# Patient Record
Sex: Male | Born: 1960 | Race: Black or African American | Hispanic: No | Marital: Single | Smoking: Current every day smoker
Health system: Southern US, Community
[De-identification: ages and names within clinical notes are randomized; demographics above are authoritative.]

## PROBLEM LIST (undated history)

## (undated) DIAGNOSIS — G8929 Other chronic pain: Secondary | ICD-10-CM

## (undated) DIAGNOSIS — I2510 Atherosclerotic heart disease of native coronary artery without angina pectoris: Secondary | ICD-10-CM

## (undated) DIAGNOSIS — N289 Disorder of kidney and ureter, unspecified: Secondary | ICD-10-CM

## (undated) DIAGNOSIS — I10 Essential (primary) hypertension: Secondary | ICD-10-CM

## (undated) DIAGNOSIS — Z21 Asymptomatic human immunodeficiency virus [HIV] infection status: Secondary | ICD-10-CM

## (undated) DIAGNOSIS — M545 Low back pain, unspecified: Secondary | ICD-10-CM

## (undated) DIAGNOSIS — M1990 Unspecified osteoarthritis, unspecified site: Secondary | ICD-10-CM

## (undated) DIAGNOSIS — I219 Acute myocardial infarction, unspecified: Secondary | ICD-10-CM

## (undated) DIAGNOSIS — B20 Human immunodeficiency virus [HIV] disease: Secondary | ICD-10-CM

## (undated) DIAGNOSIS — K219 Gastro-esophageal reflux disease without esophagitis: Secondary | ICD-10-CM

## (undated) DIAGNOSIS — G43909 Migraine, unspecified, not intractable, without status migrainosus: Secondary | ICD-10-CM

## (undated) DIAGNOSIS — G629 Polyneuropathy, unspecified: Secondary | ICD-10-CM

---

## 1997-04-26 HISTORY — PX: INCISION AND DRAINAGE OF WOUND: SHX1803

## 2007-09-10 ENCOUNTER — Emergency Department (HOSPITAL_COMMUNITY): Admission: EM | Admit: 2007-09-10 | Discharge: 2007-09-10 | Payer: Self-pay | Admitting: Emergency Medicine

## 2013-02-24 DIAGNOSIS — I219 Acute myocardial infarction, unspecified: Secondary | ICD-10-CM

## 2013-02-24 HISTORY — DX: Acute myocardial infarction, unspecified: I21.9

## 2013-02-24 HISTORY — PX: CORONARY ANGIOPLASTY WITH STENT PLACEMENT: SHX49

## 2013-05-24 DIAGNOSIS — E785 Hyperlipidemia, unspecified: Secondary | ICD-10-CM | POA: Diagnosis present

## 2013-12-31 ENCOUNTER — Inpatient Hospital Stay (HOSPITAL_COMMUNITY): Payer: PRIVATE HEALTH INSURANCE

## 2013-12-31 ENCOUNTER — Inpatient Hospital Stay (HOSPITAL_BASED_OUTPATIENT_CLINIC_OR_DEPARTMENT_OTHER)
Admission: EM | Admit: 2013-12-31 | Discharge: 2014-01-03 | DRG: 896 | Payer: PRIVATE HEALTH INSURANCE | Attending: Internal Medicine | Admitting: Internal Medicine

## 2013-12-31 ENCOUNTER — Emergency Department (HOSPITAL_BASED_OUTPATIENT_CLINIC_OR_DEPARTMENT_OTHER): Payer: PRIVATE HEALTH INSURANCE

## 2013-12-31 ENCOUNTER — Ambulatory Visit (HOSPITAL_COMMUNITY)

## 2013-12-31 ENCOUNTER — Encounter (HOSPITAL_BASED_OUTPATIENT_CLINIC_OR_DEPARTMENT_OTHER): Payer: Self-pay | Admitting: Emergency Medicine

## 2013-12-31 DIAGNOSIS — F19939 Other psychoactive substance use, unspecified with withdrawal, unspecified: Secondary | ICD-10-CM | POA: Diagnosis not present

## 2013-12-31 DIAGNOSIS — R1115 Cyclical vomiting syndrome unrelated to migraine: Secondary | ICD-10-CM

## 2013-12-31 DIAGNOSIS — I6350 Cerebral infarction due to unspecified occlusion or stenosis of unspecified cerebral artery: Secondary | ICD-10-CM | POA: Diagnosis present

## 2013-12-31 DIAGNOSIS — F1910 Other psychoactive substance abuse, uncomplicated: Secondary | ICD-10-CM | POA: Diagnosis present

## 2013-12-31 DIAGNOSIS — R9089 Other abnormal findings on diagnostic imaging of central nervous system: Secondary | ICD-10-CM | POA: Diagnosis present

## 2013-12-31 DIAGNOSIS — N183 Chronic kidney disease, stage 3 unspecified: Secondary | ICD-10-CM | POA: Diagnosis present

## 2013-12-31 DIAGNOSIS — G43909 Migraine, unspecified, not intractable, without status migrainosus: Secondary | ICD-10-CM | POA: Diagnosis present

## 2013-12-31 DIAGNOSIS — R4182 Altered mental status, unspecified: Secondary | ICD-10-CM | POA: Insufficient documentation

## 2013-12-31 DIAGNOSIS — I10 Essential (primary) hypertension: Secondary | ICD-10-CM | POA: Diagnosis present

## 2013-12-31 DIAGNOSIS — I6300 Cerebral infarction due to thrombosis of unspecified precerebral artery: Secondary | ICD-10-CM

## 2013-12-31 DIAGNOSIS — N179 Acute kidney failure, unspecified: Secondary | ICD-10-CM | POA: Diagnosis present

## 2013-12-31 DIAGNOSIS — G9340 Encephalopathy, unspecified: Secondary | ICD-10-CM | POA: Diagnosis present

## 2013-12-31 DIAGNOSIS — Z23 Encounter for immunization: Secondary | ICD-10-CM | POA: Diagnosis not present

## 2013-12-31 DIAGNOSIS — F17200 Nicotine dependence, unspecified, uncomplicated: Secondary | ICD-10-CM | POA: Diagnosis present

## 2013-12-31 DIAGNOSIS — R112 Nausea with vomiting, unspecified: Secondary | ICD-10-CM | POA: Diagnosis present

## 2013-12-31 DIAGNOSIS — N19 Unspecified kidney failure: Secondary | ICD-10-CM | POA: Diagnosis present

## 2013-12-31 DIAGNOSIS — R930 Abnormal findings on diagnostic imaging of skull and head, not elsewhere classified: Secondary | ICD-10-CM

## 2013-12-31 DIAGNOSIS — Z21 Asymptomatic human immunodeficiency virus [HIV] infection status: Secondary | ICD-10-CM | POA: Diagnosis present

## 2013-12-31 HISTORY — DX: Acute myocardial infarction, unspecified: I21.9

## 2013-12-31 HISTORY — DX: Migraine, unspecified, not intractable, without status migrainosus: G43.909

## 2013-12-31 HISTORY — DX: Low back pain, unspecified: M54.50

## 2013-12-31 HISTORY — DX: Human immunodeficiency virus (HIV) disease: B20

## 2013-12-31 HISTORY — DX: Gastro-esophageal reflux disease without esophagitis: K21.9

## 2013-12-31 HISTORY — DX: Low back pain: M54.5

## 2013-12-31 HISTORY — DX: Other chronic pain: G89.29

## 2013-12-31 HISTORY — DX: Unspecified osteoarthritis, unspecified site: M19.90

## 2013-12-31 HISTORY — DX: Essential (primary) hypertension: I10

## 2013-12-31 HISTORY — DX: Asymptomatic human immunodeficiency virus (hiv) infection status: Z21

## 2013-12-31 LAB — CBC
HCT: 48 % (ref 39.0–52.0)
Hemoglobin: 17.2 g/dL — ABNORMAL HIGH (ref 13.0–17.0)
MCH: 30.6 pg (ref 26.0–34.0)
MCHC: 35.8 g/dL (ref 30.0–36.0)
MCV: 85.4 fL (ref 78.0–100.0)
PLATELETS: 257 10*3/uL (ref 150–400)
RBC: 5.62 MIL/uL (ref 4.22–5.81)
RDW: 13.1 % (ref 11.5–15.5)
WBC: 12.4 10*3/uL — ABNORMAL HIGH (ref 4.0–10.5)

## 2013-12-31 LAB — BLOOD GAS, ARTERIAL
Acid-Base Excess: 1 mmol/L (ref 0.0–2.0)
BICARBONATE: 24.9 meq/L — AB (ref 20.0–24.0)
Drawn by: 36529
FIO2: 0.21 %
O2 Saturation: 93.2 %
PATIENT TEMPERATURE: 98.6
TCO2: 26 mmol/L (ref 0–100)
pCO2 arterial: 37.8 mmHg (ref 35.0–45.0)
pH, Arterial: 7.433 (ref 7.350–7.450)
pO2, Arterial: 66.1 mmHg — ABNORMAL LOW (ref 80.0–100.0)

## 2013-12-31 LAB — DIFFERENTIAL
Basophils Absolute: 0 10*3/uL (ref 0.0–0.1)
Basophils Relative: 0 % (ref 0–1)
Eosinophils Absolute: 0 10*3/uL (ref 0.0–0.7)
Eosinophils Relative: 0 % (ref 0–5)
LYMPHS ABS: 2.8 10*3/uL (ref 0.7–4.0)
Lymphocytes Relative: 22 % (ref 12–46)
MONOS PCT: 10 % (ref 3–12)
Monocytes Absolute: 1.3 10*3/uL — ABNORMAL HIGH (ref 0.1–1.0)
NEUTROS ABS: 8.6 10*3/uL — AB (ref 1.7–7.7)
NEUTROS PCT: 68 % (ref 43–77)

## 2013-12-31 LAB — RAPID URINE DRUG SCREEN, HOSP PERFORMED
Amphetamines: NOT DETECTED
BENZODIAZEPINES: POSITIVE — AB
Barbiturates: NOT DETECTED
Cocaine: POSITIVE — AB
Opiates: NOT DETECTED
Tetrahydrocannabinol: POSITIVE — AB

## 2013-12-31 LAB — COMPREHENSIVE METABOLIC PANEL
ALT: 19 U/L (ref 0–53)
ALT: 16 U/L (ref 0–53)
ANION GAP: 12 (ref 5–15)
AST: 18 U/L (ref 0–37)
AST: 25 U/L (ref 0–37)
Albumin: 4 g/dL (ref 3.5–5.2)
Albumin: 3.5 g/dL (ref 3.5–5.2)
Alkaline Phosphatase: 59 U/L (ref 39–117)
Alkaline Phosphatase: 53 U/L (ref 39–117)
Anion gap: 16 — ABNORMAL HIGH (ref 5–15)
BUN: 12 mg/dL (ref 6–23)
BUN: 10 mg/dL (ref 6–23)
CALCIUM: 10.5 mg/dL (ref 8.4–10.5)
CALCIUM: 8.9 mg/dL (ref 8.4–10.5)
CO2: 23 meq/L (ref 19–32)
CO2: 24 meq/L (ref 19–32)
CREATININE: 1.6 mg/dL — AB (ref 0.50–1.35)
Chloride: 95 mEq/L — ABNORMAL LOW (ref 96–112)
Chloride: 97 mEq/L (ref 96–112)
Creatinine, Ser: 1.41 mg/dL — ABNORMAL HIGH (ref 0.50–1.35)
GFR calc Af Amer: 56 mL/min — ABNORMAL LOW (ref >90)
GFR, EST AFRICAN AMERICAN: 65 mL/min — AB (ref >90)
GFR, EST NON AFRICAN AMERICAN: 48 mL/min — AB (ref >90)
GFR, EST NON AFRICAN AMERICAN: 56 mL/min — AB (ref >90)
GLUCOSE: 116 mg/dL — AB (ref 70–99)
Glucose, Bld: 118 mg/dL — ABNORMAL HIGH (ref 70–99)
Potassium: 4 mEq/L (ref 3.7–5.3)
Potassium: 3.9 mEq/L (ref 3.7–5.3)
Sodium: 135 mEq/L — ABNORMAL LOW (ref 137–147)
Sodium: 132 mEq/L — ABNORMAL LOW (ref 137–147)
TOTAL PROTEIN: 10.1 g/dL — AB (ref 6.0–8.3)
Total Bilirubin: 0.7 mg/dL (ref 0.3–1.2)
Total Bilirubin: 0.4 mg/dL (ref 0.3–1.2)
Total Protein: 8.4 g/dL — ABNORMAL HIGH (ref 6.0–8.3)

## 2013-12-31 LAB — URINE MICROSCOPIC-ADD ON

## 2013-12-31 LAB — URINALYSIS, ROUTINE W REFLEX MICROSCOPIC
Bilirubin Urine: NEGATIVE
GLUCOSE, UA: NEGATIVE mg/dL
Ketones, ur: 15 mg/dL — AB
LEUKOCYTES UA: NEGATIVE
Nitrite: NEGATIVE
PROTEIN: 100 mg/dL — AB
SPECIFIC GRAVITY, URINE: 1.014 (ref 1.005–1.030)
UROBILINOGEN UA: 0.2 mg/dL (ref 0.0–1.0)
pH: 6 (ref 5.0–8.0)

## 2013-12-31 LAB — CBG MONITORING, ED: Glucose-Capillary: 102 mg/dL — ABNORMAL HIGH (ref 70–99)

## 2013-12-31 LAB — AMMONIA: AMMONIA: 29 umol/L (ref 11–60)

## 2013-12-31 LAB — CBC WITH DIFFERENTIAL/PLATELET
Basophils Absolute: 0 10*3/uL (ref 0.0–0.1)
Basophils Relative: 0 % (ref 0–1)
EOS ABS: 0 10*3/uL (ref 0.0–0.7)
Eosinophils Relative: 0 % (ref 0–5)
HCT: 42.2 % (ref 39.0–52.0)
HEMOGLOBIN: 15.3 g/dL (ref 13.0–17.0)
Lymphocytes Relative: 26 % (ref 12–46)
Lymphs Abs: 2.7 10*3/uL (ref 0.7–4.0)
MCH: 31.3 pg (ref 26.0–34.0)
MCHC: 36.3 g/dL — AB (ref 30.0–36.0)
MCV: 86.3 fL (ref 78.0–100.0)
MONOS PCT: 9 % (ref 3–12)
Monocytes Absolute: 1 10*3/uL (ref 0.1–1.0)
NEUTROS ABS: 6.7 10*3/uL (ref 1.7–7.7)
NEUTROS PCT: 65 % (ref 43–77)
PLATELETS: 216 10*3/uL (ref 150–400)
RBC: 4.89 MIL/uL (ref 4.22–5.81)
RDW: 13.2 % (ref 11.5–15.5)
WBC: 10.3 10*3/uL (ref 4.0–10.5)

## 2013-12-31 LAB — I-STAT CG4 LACTIC ACID, ED: LACTIC ACID, VENOUS: 1.37 mmol/L (ref 0.5–2.2)

## 2013-12-31 LAB — MRSA PCR SCREENING: MRSA BY PCR: POSITIVE — AB

## 2013-12-31 LAB — TSH: TSH: 1.14 u[IU]/mL (ref 0.350–4.500)

## 2013-12-31 LAB — LIPASE, BLOOD: Lipase: 22 U/L (ref 11–59)

## 2013-12-31 MED ORDER — DEXTROSE 5 % IV SOLN
500.0000 mg | Freq: Once | INTRAVENOUS | Status: AC
  Administered 2013-12-31: 500 mg via INTRAVENOUS
  Filled 2013-12-31: qty 500

## 2013-12-31 MED ORDER — ACETAMINOPHEN 325 MG PO TABS
650.0000 mg | ORAL_TABLET | Freq: Four times a day (QID) | ORAL | Status: DC | PRN
Start: 2013-12-31 — End: 2014-01-03
  Administered 2014-01-01: 650 mg via ORAL
  Filled 2013-12-31: qty 2

## 2013-12-31 MED ORDER — DEXTROSE 5 % IV SOLN
1.0000 g | INTRAVENOUS | Status: DC
  Administered 2014-01-01 – 2014-01-02 (×2): 1 g via INTRAVENOUS
  Filled 2013-12-31 (×2): qty 10

## 2013-12-31 MED ORDER — ENOXAPARIN SODIUM 40 MG/0.4ML ~~LOC~~ SOLN
40.0000 mg | SUBCUTANEOUS | Status: DC
  Administered 2013-12-31 – 2014-01-03 (×4): 40 mg via SUBCUTANEOUS
  Filled 2013-12-31 (×5): qty 0.4

## 2013-12-31 MED ORDER — FOLIC ACID 1 MG PO TABS
1.0000 mg | ORAL_TABLET | Freq: Every day | ORAL | Status: DC
  Administered 2013-12-31 – 2014-01-03 (×4): 1 mg via ORAL
  Filled 2013-12-31 (×4): qty 1

## 2013-12-31 MED ORDER — CHLORHEXIDINE GLUCONATE CLOTH 2 % EX PADS
6.0000 | MEDICATED_PAD | Freq: Every day | CUTANEOUS | Status: DC
  Administered 2014-01-01 – 2014-01-03 (×3): 6 via TOPICAL

## 2013-12-31 MED ORDER — ONDANSETRON HCL 4 MG PO TABS: 4.0000 mg | ORAL_TABLET | Freq: Four times a day (QID) | ORAL | Status: DC | PRN

## 2013-12-31 MED ORDER — ASPIRIN 300 MG RE SUPP
300.0000 mg | Freq: Every day | RECTAL | Status: DC
  Administered 2013-12-31 – 2014-01-01 (×2): 300 mg via RECTAL
  Filled 2013-12-31 (×3): qty 1

## 2013-12-31 MED ORDER — IOHEXOL 300 MG/ML  SOLN
80.0000 mL | Freq: Once | INTRAMUSCULAR | Status: AC | PRN
  Administered 2013-12-31: 80 mL via INTRAVENOUS

## 2013-12-31 MED ORDER — MUPIROCIN 2 % EX OINT
1.0000 "application " | TOPICAL_OINTMENT | Freq: Two times a day (BID) | CUTANEOUS | Status: DC
  Administered 2013-12-31 – 2014-01-03 (×6): 1 via NASAL
  Filled 2013-12-31: qty 22

## 2013-12-31 MED ORDER — THIAMINE HCL 100 MG/ML IJ SOLN
100.0000 mg | Freq: Every day | INTRAMUSCULAR | Status: DC
Start: 2013-12-31 — End: 2014-01-01
  Filled 2013-12-31 (×2): qty 1

## 2013-12-31 MED ORDER — ADULT MULTIVITAMIN W/MINERALS CH
1.0000 | ORAL_TABLET | Freq: Every day | ORAL | Status: DC
Start: 2013-12-31 — End: 2014-01-03
  Administered 2013-12-31 – 2014-01-03 (×4): 1 via ORAL
  Filled 2013-12-31 (×4): qty 1

## 2013-12-31 MED ORDER — DEXTROSE 5 % IV SOLN
500.0000 mg | INTRAVENOUS | Status: DC
  Administered 2014-01-01 – 2014-01-02 (×2): 500 mg via INTRAVENOUS
  Filled 2013-12-31 (×3): qty 500

## 2013-12-31 MED ORDER — DEXTROSE 5 % IV SOLN
1.0000 g | Freq: Once | INTRAVENOUS | Status: AC
  Administered 2013-12-31: 1 g via INTRAVENOUS

## 2013-12-31 MED ORDER — SODIUM CHLORIDE 0.9 % IV SOLN
INTRAVENOUS | Status: AC
  Administered 2013-12-31: 22:00:00 via INTRAVENOUS

## 2013-12-31 MED ORDER — ACETAMINOPHEN 650 MG RE SUPP: 650.0000 mg | Freq: Four times a day (QID) | RECTAL | Status: DC | PRN

## 2013-12-31 MED ORDER — LORAZEPAM 2 MG/ML IJ SOLN
1.0000 mg | Freq: Four times a day (QID) | INTRAMUSCULAR | Status: AC | PRN
  Administered 2014-01-01: 1 mg via INTRAVENOUS
  Filled 2013-12-31: qty 1

## 2013-12-31 MED ORDER — GI COCKTAIL ~~LOC~~
30.0000 mL | Freq: Once | ORAL | Status: AC
  Administered 2013-12-31: 30 mL via ORAL
  Filled 2013-12-31: qty 30

## 2013-12-31 MED ORDER — VITAMIN B-1 100 MG PO TABS
100.0000 mg | ORAL_TABLET | Freq: Every day | ORAL | Status: DC
  Administered 2013-12-31 – 2014-01-03 (×4): 100 mg via ORAL
  Filled 2013-12-31 (×4): qty 1

## 2013-12-31 MED ORDER — CEFTRIAXONE SODIUM 1 G IJ SOLR
INTRAMUSCULAR | Status: AC
  Filled 2013-12-31: qty 10

## 2013-12-31 MED ORDER — LORAZEPAM 1 MG PO TABS: 1.0000 mg | ORAL_TABLET | Freq: Four times a day (QID) | ORAL | Status: AC | PRN

## 2013-12-31 MED ORDER — SODIUM CHLORIDE 0.9 % IJ SOLN
3.0000 mL | Freq: Two times a day (BID) | INTRAMUSCULAR | Status: DC
  Administered 2014-01-01 – 2014-01-03 (×4): 3 mL via INTRAVENOUS

## 2013-12-31 MED ORDER — ABACAVIR SULFATE-LAMIVUDINE 600-300 MG PO TABS
1.0000 | ORAL_TABLET | Freq: Every day | ORAL | Status: DC
  Filled 2013-12-31: qty 1

## 2013-12-31 MED ORDER — ABACAVIR SULFATE 300 MG PO TABS
600.0000 mg | ORAL_TABLET | Freq: Every day | ORAL | Status: DC
  Administered 2013-12-31 – 2014-01-03 (×4): 600 mg via ORAL
  Filled 2013-12-31 (×4): qty 2

## 2013-12-31 MED ORDER — ONDANSETRON HCL 4 MG/2ML IJ SOLN
4.0000 mg | Freq: Four times a day (QID) | INTRAMUSCULAR | Status: DC | PRN
  Administered 2014-01-01 – 2014-01-02 (×2): 4 mg via INTRAVENOUS
  Filled 2013-12-31 (×2): qty 2

## 2013-12-31 MED ORDER — DARUNAVIR-COBICISTAT 800-150 MG PO TABS
1.0000 | ORAL_TABLET | Freq: Every day | ORAL | Status: DC
  Administered 2013-12-31 – 2014-01-01 (×2): 1 via ORAL
  Filled 2013-12-31 (×2): qty 1

## 2013-12-31 MED ORDER — PNEUMOCOCCAL VAC POLYVALENT 25 MCG/0.5ML IJ INJ
0.5000 mL | INJECTION | INTRAMUSCULAR | Status: AC
  Administered 2014-01-01: 0.5 mL via INTRAMUSCULAR
  Filled 2013-12-31: qty 0.5

## 2013-12-31 MED ORDER — LAMIVUDINE 150 MG PO TABS
300.0000 mg | ORAL_TABLET | Freq: Every day | ORAL | Status: DC
  Administered 2013-12-31 – 2014-01-03 (×4): 300 mg via ORAL
  Filled 2013-12-31 (×4): qty 2

## 2013-12-31 MED ORDER — IOHEXOL 300 MG/ML  SOLN
50.0000 mL | Freq: Once | INTRAMUSCULAR | Status: AC | PRN
  Administered 2013-12-31: 50 mL via ORAL
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
## 2013-12-31 NOTE — ED Notes (Signed)
I took CBG sample, got result of 102 mg. / dcltr.

## 2013-12-31 NOTE — Consult Note (Signed)
Referring Physician: Jeanella Anton    Chief Complaint: Altered mental status and abnormal MRI study.  HPI: Tony Whitaker is an 53 y.o. male with a history of hypertension, myocardial infarction, migraine headaches, HIV infection and chronic low back pain, admitted today for management of altered mental status with associated nausea and vomiting. CT scan of his head was unremarkable. Urine drug screen was positive for cocaine, benzodiazepines and marijuana. MRI showed an 8 mm diffusion restricted lesion involving the left centrum semiovale. Lesion was somewhat age indeterminate. Subacute infarction cannot be ruled out. MRA showed no large vessel occlusion/severe stenosis. NIH stroke score was 1 with persistent confusion.  LSN: Unclear tPA Given: No: No focal deficits and unknown when last seen well MRankin: 1  Past Medical History  Diagnosis Date  . HIV (human immunodeficiency virus infection)   . Hypertension   . Myocardial infarction 02/2013  . GERD (gastroesophageal reflux disease)   . Migraine     "2-3 times/yr" (12/31/2013)  . Arthritis     "around my belt line" (12/31/2013)  . Chronic lower back pain     Family History  Problem Relation Age of Onset  . Family history unknown: Yes     Medications: I have reviewed the patient's current medications.  ROS: History obtained from chart review  General ROS: negative for - chills, fatigue, fever, night sweats, weight gain or weight loss Psychological ROS: Abnormal mental status, as noted in history of present illness Ophthalmic ROS: negative for - blurry vision, double vision, eye pain or loss of vision ENT ROS: negative for - epistaxis, nasal discharge, oral lesions, sore throat, tinnitus or vertigo Allergy and Immunology ROS: negative for - hives or itchy/watery eyes Hematological and Lymphatic ROS: negative for - bleeding problems, bruising or swollen lymph nodes Endocrine ROS: negative for - galactorrhea, hair pattern changes,  polydipsia/polyuria or temperature intolerance Respiratory ROS: negative for - cough, hemoptysis, shortness of breath or wheezing Cardiovascular ROS: negative for - chest pain, dyspnea on exertion, edema or irregular heartbeat Gastrointestinal ROS: negative for - abdominal pain, diarrhea, hematemesis, nausea/vomiting or stool incontinence Genito-Urinary ROS: negative for - dysuria, hematuria, incontinence or urinary frequency/urgency Musculoskeletal ROS: negative for - joint swelling or muscular weakness Neurological ROS: as noted in HPI Dermatological ROS: negative for rash and skin lesion changes  Physical Examination: Blood pressure 158/96, pulse 62, temperature 97.7 F (36.5 C), temperature source Oral, resp. rate 16, height  (1.651 m), weight 74.844 kg (165 lb), SpO2 96.00%.  Neurologic Examination: Mental Status: Alert, disoriented to time and place, no acute distress.  Speech fluent without evidence of aphasia. Able to follow commands without difficulty. Cranial Nerves: II-Visual fields were normal. III/IV/VI-Pupils were equal and reacted. Extraocular movements were full and conjugate.    V/VII-no facial numbness and no facial weakness. VIII-normal. X-normal speech and symmetrical palatal movement. Motor: 5/5 bilaterally with normal tone and bulk Sensory: Normal throughout. Deep Tendon Reflexes: Trace to 1+ and symmetric in upper extremities and absent in lower extremities. Plantars: Mute bilaterally Cerebellar: Normal finger-to-nose testing.  Dg Chest 2 View  12/31/2013   CLINICAL DATA:  Cough.  HIV.  EXAM: CHEST  2 VIEW  COMPARISON:  03/17/2013  FINDINGS: Normal heart size and pulmonary vascularity. Interstitial changes in both lungs, possibly representing interstitial pneumonitis or fibrosis. No focal consolidation or airspace disease. No blunting of costophrenic angles. No pneumothorax. Tortuous aorta.  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   Electronically Signed   By: Chrissie Noa  Andria Meuse M.D.   On: 12/31/2013 01:51   Ct Head Wo Contrast  12/31/2013   CLINICAL DATA:  Altered mental status normal and slurred speech. Multiple falls yesterday. Vomiting and constipation.  EXAM: CT HEAD WITHOUT CONTRAST  TECHNIQUE: Contiguous axial images were obtained from the base of the skull through the vertex without intravenous contrast.  COMPARISON:  None.  FINDINGS: Technically limited study due to motion artifact. Ventricles and sulci appear symmetrical. No mass effect or midline shift. No abnormal extra-axial fluid collections. Gray-white matter junctions are distinct. Basal cisterns are not effaced. No evidence of acute intracranial hemorrhage. No depressed skull fractures. Visualized paranasal sinuses and mastoid air cells are not opacified. Vascular calcifications.  IMPRESSION: No acute intracranial abnormalities.   Electronically Signed   By: Burman Nieves M.D.   On: 12/31/2013 02:02   Mr Maxine Glenn Head Wo Contrast  12/31/2013   CLINICAL DATA:  Altered mental status.  HIV.  EXAM: MRI HEAD WITHOUT CONTRAST  MRA HEAD WITHOUT CONTRAST  TECHNIQUE: Multiplanar, multiecho pulse sequences of the brain and surrounding structures were obtained without intravenous contrast. Angiographic images of the head were obtained using MRA technique without contrast.  COMPARISON:  Head CT 12/31/2013  FINDINGS: MRI HEAD FINDINGS  Images are mildly to moderately degraded by motion artifact. Gradient echo/susceptibility weighted images were not obtained due to patient's inability to cooperate with the examination.  There is an 8 mm T2 hyperintense focus in the left centrum semiovale which demonstrates a peripheral ring of restricted diffusion with evidence of facilitated diffusion centrally. No other restricted diffusion is seen elsewhere to indicate acute infarct. No gross intracranial hemorrhage is seen. There is no midline shift or extra-axial fluid  collection. Several small, scattered foci of T2 hyperintensity are present elsewhere in the subcortical and deep cerebral white matter. Ventricles and sulci are within normal limits for age.  Orbits are unremarkable. Paranasal sinuses and mastoid air cells are clear. Major intracranial vascular flow voids are preserved.  MRA HEAD FINDINGS  Images are mildly to moderately degraded by motion artifact.  Visualized distal vertebral arteries are patent and codominant. PICA origins are patent. Basilar artery is patent. There is apparent moderate to severe irregular narrowing of the proximal and mid basilar artery, although evaluation is limited by motion through this area and the basilar is relatively normal in appearance on whole brain T2 weighted images. Distal basilar artery is unremarkable. SCA origins are patent. There are fetal type origins of the PCAs bilaterally, with the left P1 segment being hypoplastic and the right P1 segment likely absent. No high-grade proximal PCA stenosis is seen. There is mild-to-moderate PCA branch vessel irregularity.  Internal carotid arteries are patent from skullbase to carotid termini without evidence significant stenosis. There is a 1.5 mm posteriorly directed outpouching from the distal left supraclinoid ICA. Right A1 segment is either severely hypoplastic or absent. The right A2 is supplied by the left ACA via the anterior communicating artery. No proximal MCA stenosis is seen. There is mild-to-moderate bilateral MCA branch vessel irregular narrowing.  IMPRESSION: 1. No acute or subacute large territory infarct. 2. 8 mm peripherally diffusion restricting lesion in the left centrum semiovale, indeterminate. Considerations include subacute infarct, opportunistic infection, demyelination, and metastasis. Postcontrast imaging may be helpful for further evaluation. Of note, there is no significant edema surrounding this lesion. 3. Several additional foci of white matter T2 signal  abnormality, nonspecific. Considerations include mild chronic small vessel ischemic disease, sequelae of infection, demyelination, vasculitis, and trauma. 4. Motion degraded  MRA.  No major intracranial arterial occlusion. 5. Apparent moderate to severe narrowing of the proximal to mid basilar artery is felt to be at least largely in part artifactual due to motion. No significant proximal stenosis is seen in the anterior circulation. Mild-to-moderate anterior and posterior circulation branch vessel irregularity. 6. 1.5 mm infundibulum versus tiny aneurysm of the distal left ICA.   Electronically Signed   By: Sebastian Ache   On: 12/31/2013 16:30   Mr Brain Wo Contrast  12/31/2013   CLINICAL DATA:  Altered mental status.  HIV.  EXAM: MRI HEAD WITHOUT CONTRAST  MRA HEAD WITHOUT CONTRAST  TECHNIQUE: Multiplanar, multiecho pulse sequences of the brain and surrounding structures were obtained without intravenous contrast. Angiographic images of the head were obtained using MRA technique without contrast.  COMPARISON:  Head CT 12/31/2013  FINDINGS: MRI HEAD FINDINGS  Images are mildly to moderately degraded by motion artifact. Gradient echo/susceptibility weighted images were not obtained due to patient's inability to cooperate with the examination.  There is an 8 mm T2 hyperintense focus in the left centrum semiovale which demonstrates a peripheral ring of restricted diffusion with evidence of facilitated diffusion centrally. No other restricted diffusion is seen elsewhere to indicate acute infarct. No gross intracranial hemorrhage is seen. There is no midline shift or extra-axial fluid collection. Several small, scattered foci of T2 hyperintensity are present elsewhere in the subcortical and deep cerebral white matter. Ventricles and sulci are within normal limits for age.  Orbits are unremarkable. Paranasal sinuses and mastoid air cells are clear. Major intracranial vascular flow voids are preserved.  MRA HEAD FINDINGS   Images are mildly to moderately degraded by motion artifact.  Visualized distal vertebral arteries are patent and codominant. PICA origins are patent. Basilar artery is patent. There is apparent moderate to severe irregular narrowing of the proximal and mid basilar artery, although evaluation is limited by motion through this area and the basilar is relatively normal in appearance on whole brain T2 weighted images. Distal basilar artery is unremarkable. SCA origins are patent. There are fetal type origins of the PCAs bilaterally, with the left P1 segment being hypoplastic and the right P1 segment likely absent. No high-grade proximal PCA stenosis is seen. There is mild-to-moderate PCA branch vessel irregularity.  Internal carotid arteries are patent from skullbase to carotid termini without evidence significant stenosis. There is a 1.5 mm posteriorly directed outpouching from the distal left supraclinoid ICA. Right A1 segment is either severely hypoplastic or absent. The right A2 is supplied by the left ACA via the anterior communicating artery. No proximal MCA stenosis is seen. There is mild-to-moderate bilateral MCA branch vessel irregular narrowing.  IMPRESSION: 1. No acute or subacute large territory infarct. 2. 8 mm peripherally diffusion restricting lesion in the left centrum semiovale, indeterminate. Considerations include subacute infarct, opportunistic infection, demyelination, and metastasis. Postcontrast imaging may be helpful for further evaluation. Of note, there is no significant edema surrounding this lesion. 3. Several additional foci of white matter T2 signal abnormality, nonspecific. Considerations include mild chronic small vessel ischemic disease, sequelae of infection, demyelination, vasculitis, and trauma. 4. Motion degraded MRA.  No major intracranial arterial occlusion. 5. Apparent moderate to severe narrowing of the proximal to mid basilar artery is felt to be at least largely in part  artifactual due to motion. No significant proximal stenosis is seen in the anterior circulation. Mild-to-moderate anterior and posterior circulation branch vessel irregularity. 6. 1.5 mm infundibulum versus tiny aneurysm of the distal left ICA.  Electronically Signed   By: Sebastian Ache   On: 12/31/2013 16:30   Ct Abdomen Pelvis W Contrast  12/31/2013   CLINICAL DATA:  Abdominal pain.  EXAM: CT ABDOMEN AND PELVIS WITH CONTRAST  TECHNIQUE: Multidetector CT imaging of the abdomen and pelvis was performed using the standard protocol following bolus administration of intravenous contrast.  CONTRAST:  80mL OMNIPAQUE IOHEXOL 300 MG/ML SOLN, 50mL OMNIPAQUE IOHEXOL 300 MG/ML SOLN  COMPARISON:  02/16/2011  FINDINGS: Atelectasis in the lung bases. Suggestion of small focal area of infiltration in the right lung base. Coronary artery calcifications.  Diffuse fatty infiltration of the liver with focal more prominent fatty infiltration along the falciform ligament. The gallbladder, spleen, pancreas, adrenal glands, abdominal aorta, inferior vena cava, and retroperitoneal lymph nodes are unremarkable. Subcentimeter parenchymal cysts in the kidneys. No hydronephrosis or hydroureter. Small accessory spleen. Stomach and small bowel are normal for degree of distention. Scattered stool in the colon without distention or wall thickening. No free air or free fluid in the abdomen.  Pelvis: The appendix is normal. No evidence of diverticulitis. Bladder wall is not thickened. Prostate gland is not enlarged. No free or loculated pelvic fluid collections. No pelvic mass or lymphadenopathy. Spondylolysis with minimal spondylolisthesis at L4-5. No destructive bone lesions.  IMPRESSION: No focal acute process demonstrated in the abdomen or pelvis. Atelectasis in the lung bases with suggestion of small focal infiltration in the right lung base.   Electronically Signed   By: Burman Nieves M.D.   On: 12/31/2013 03:29    Assessment: 53 y.o.  male presenting with altered mental status and abnormality on MRI study of unclear etiology. Subacute stroke could not be ruled out.  Stroke Risk Factors - hyperlipidemia  Plan: 1. HgbA1c, fasting lipid panel 2. MRI of the brain with contrast 3. Echocardiogram 4. Carotid dopplers 5. Prophylactic therapy-Antiplatelet med: Aspirin 325 mg per day 6. Risk factor modification 7. Telemetry monitoring   C.R. Roseanne Reno, MD [facility] [phone]  12/31/2013, 8:09 PM

## 2013-12-31 NOTE — ED Provider Notes (Signed)
CSN: [id number]     Arrival date & time 12/31/13  0000 History   First MD Initiated Contact with Patient 12/31/13 [phone]     Chief Complaint  Patient presents with  . Altered Mental Status     (Consider location/radiation/quality/duration/timing/severity/associated sxs/prior Treatment) HPI Level 5 Caveat: altered mental status. Is a 53 year old male jail in [place] with a history of HIV disease. He is brought in by sheriff's deputies. He was arrested 12/27/2013 and placed in a regular cell. He reportedly was vomiting and complaining of abdominal pain. He developed an altered mental status on Saturday 12/29/2013 which was suspected to be due to a drug overdose. He spend 24 hours in the medical unit before being transferred here.  Nurse called the medical unit at jail, spoke with [facility]. Sts that pt was last seen "normal" yesterday morning (Saturday). Sts the pt fell 3 times yesterday. Also sts the pt had some ensure on Sunday morning, but just a small amount. Reports the pt hasn't eaten or drank since Saturday. Reports pt c/o vomiting and constipation. Sts that pt has a history of taking opiates, benzos, and alcohol. He is on a Librium taper. Jail sent KUB results showing ileus pattern. He spent the last 24 hours in the [place] medical unit. Sheriff's deputies suspect the patient had taken an unspecified drug while in custody. He is noted to be lethargic with a decreased mental status on arrival. Sheriffs deputies report patient noted to be more active, adjusting sheets and making other purposeful movements, when he doesn't think anyone is observing him. Nursing staff reports patient was coughing frequently on arrival.   Past Medical History  Diagnosis Date  . HIV (human immunodeficiency virus infection)    History reviewed. No pertinent past surgical history. No family history on file. History  Substance Use Topics  . Smoking status: Current Every Day Smoker  . Smokeless tobacco: Not on file  . Alcohol  Use: Yes    Review of Systems  Unable to perform ROS   Allergies  Review of patient's allergies indicates no known allergies.  Home Medications   Prior to Admission medications   Not on File   BP 163/98  Pulse 54  Temp(Src) 98.1 F (36.7 C) (Oral)  Resp 16  Wt 165 lb (74.844 kg)  SpO2 95%  Physical Exam General: Well-developed, well-nourished male in no acute distress; appearance consistent with age of record HENT: normocephalic; atraumatic Eyes: pupils equal, round and reactive to light; pronounced arcus senilis bilaterally Neck: supple Heart: regular rate and rhythm Lungs: clear to auscultation bilaterally Abdomen: soft; nondistended; diffusely tender; no masses or hepatosplenomegaly; bowel sounds present Extremities: No deformity; full range of motion; pulses normal Neurologic: lethargic; can answer simple questions; noted to move all extremities; no facial droop Skin: Warm and dry    ED Course  Procedures (including critical care time)  CRITICAL CARE Performed by: Isayah Ignasiak L Total critical care time: 45 minutes Critical care time was exclusive of separately billable procedures and treating other patients. Critical care was necessary to treat or prevent imminent or life-threatening deterioration. Critical care was time spent personally by me on the following activities: development of treatment plan with patient and/or surrogate as well as nursing, discussions with consultants, evaluation of patient's response to treatment, examination of patient, obtaining history from patient or surrogate, ordering and performing treatments and interventions, ordering and review of laboratory studies, ordering and review of radiographic studies, pulse oximetry and re-evaluation of patient's condition.   MDM  Nursing notes and vitals signs, including pulse oximetry, reviewed.  Summary of this visit's results, reviewed by myself:  EKG Interpretation:  Date & Time:  12/31/2013 5:04 AM  Rate: 56  Rhythm: sinus bradycardia  QRS Axis: normal  Intervals: normal  ST/T Wave abnormalities: normal  Conduction Disutrbances:none  Narrative Interpretation: LAH  Old EKG Reviewed: none available  Labs:  Results for orders placed during the hospital encounter of 12/31/13 (from the past 24 hour(s))  CBC     Status: Abnormal   Collection Time    12/31/13 12:30 AM      Result Value Ref Range   WBC 12.4 (*) 4.0 - 10.5 K/uL   RBC 5.62  4.22 - 5.81 MIL/uL   Hemoglobin 17.2 (*) 13.0 - 17.0 g/dL   HCT 16.1  09.6 - 04.5 %   MCV 85.4  78.0 - 100.0 fL   MCH 30.6  26.0 - 34.0 pg   MCHC 35.8  30.0 - 36.0 g/dL   RDW 40.9  81.1 - 91.4 %   Platelets 257  150 - 400 K/uL  COMPREHENSIVE METABOLIC PANEL     Status: Abnormal   Collection Time    12/31/13 12:30 AM      Result Value Ref Range   Sodium 135 (*) 137 - 147 mEq/L   Potassium 4.0  3.7 - 5.3 mEq/L   Chloride 95 (*) 96 - 112 mEq/L   CO2 24  19 - 32 mEq/L   Glucose, Bld 118 (*) 70 - 99 mg/dL   BUN 12  6 - 23 mg/dL   Creatinine, Ser 7.82 (*) 0.50 - 1.35 mg/dL   Calcium 95.6  8.4 - 21.3 mg/dL   Total Protein 08.6 (*) 6.0 - 8.3 g/dL   Albumin 4.0  3.5 - 5.2 g/dL   AST 25  0 - 37 U/L   ALT 19  0 - 53 U/L   Alkaline Phosphatase 59  39 - 117 U/L   Total Bilirubin 0.7  0.3 - 1.2 mg/dL   GFR calc non Af Amer 48 (*) >90 mL/min   GFR calc Af Amer 56 (*) >90 mL/min   Anion gap 16 (*) 5 - 15  DIFFERENTIAL     Status: Abnormal   Collection Time    12/31/13 12:30 AM      Result Value Ref Range   Neutrophils Relative % 68  43 - 77 %   Neutro Abs 8.6 (*) 1.7 - 7.7 K/uL   Lymphocytes Relative 22  12 - 46 %   Lymphs Abs 2.8  0.7 - 4.0 K/uL   Monocytes Relative 10  3 - 12 %   Monocytes Absolute 1.3 (*) 0.1 - 1.0 K/uL   Eosinophils Relative 0  0 - 5 %   Eosinophils Absolute 0.0  0.0 - 0.7 K/uL   Basophils Relative 0  0 - 1 %   Basophils Absolute 0.0  0.0 - 0.1 K/uL  LIPASE, BLOOD     Status: None   Collection Time     12/31/13 12:30 AM      Result Value Ref Range   Lipase 22  11 - 59 U/L  I-STAT CG4 LACTIC ACID, ED     Status: None   Collection Time    12/31/13 12:39 AM      Result Value Ref Range   Lactic Acid, Venous 1.37  0.5 - 2.2 mmol/L  CBG MONITORING, ED     Status: Abnormal   Collection Time  12/31/13  1:14 AM      Result Value Ref Range   Glucose-Capillary 102 (*) 70 - 99 mg/dL  URINALYSIS, ROUTINE W REFLEX MICROSCOPIC     Status: Abnormal   Collection Time    12/31/13  1:55 AM      Result Value Ref Range   Color, Urine YELLOW  YELLOW   APPearance CLEAR  CLEAR   Specific Gravity, Urine 1.014  1.005 - 1.030   pH 6.0  5.0 - 8.0   Glucose, UA NEGATIVE  NEGATIVE mg/dL   Hgb urine dipstick MODERATE (*) NEGATIVE   Bilirubin Urine NEGATIVE  NEGATIVE   Ketones, ur 15 (*) NEGATIVE mg/dL   Protein, ur 161 (*) NEGATIVE mg/dL   Urobilinogen, UA 0.2  0.0 - 1.0 mg/dL   Nitrite NEGATIVE  NEGATIVE   Leukocytes, UA NEGATIVE  NEGATIVE  URINE RAPID DRUG SCREEN (HOSP PERFORMED)     Status: Abnormal   Collection Time    12/31/13  1:55 AM      Result Value Ref Range   Opiates NONE DETECTED  NONE DETECTED   Cocaine POSITIVE (*) NONE DETECTED   Benzodiazepines POSITIVE (*) NONE DETECTED   Amphetamines NONE DETECTED  NONE DETECTED   Tetrahydrocannabinol POSITIVE (*) NONE DETECTED   Barbiturates NONE DETECTED  NONE DETECTED  URINE MICROSCOPIC-ADD ON     Status: Abnormal   Collection Time    12/31/13  1:55 AM      Result Value Ref Range   Squamous Epithelial / LPF FEW (*) RARE   WBC, UA 0-2  <3 WBC/hpf   RBC / HPF 7-10  <3 RBC/hpf   Bacteria, UA FEW (*) RARE   Casts HYALINE CASTS (*) NEGATIVE    Imaging Studies: Dg Chest 2 View  12/31/2013   CLINICAL DATA:  Cough.  HIV.  EXAM: CHEST  2 VIEW  COMPARISON:  03/17/2013  FINDINGS: Normal heart size and pulmonary vascularity. Interstitial changes in both lungs, possibly representing interstitial pneumonitis or fibrosis. No focal consolidation or  airspace disease. No blunting of costophrenic angles. No pneumothorax. Tortuous aorta.  IMPRESSION: Diffuse interstitial process in the lungs suggesting interstitial pneumonitis.   Electronically Signed   By: Burman Nieves M.D.   On: 12/31/2013 01:51   Ct Head Wo Contrast  12/31/2013   CLINICAL DATA:  Altered mental status normal and slurred speech. Multiple falls yesterday. Vomiting and constipation.  EXAM: CT HEAD WITHOUT CONTRAST  TECHNIQUE: Contiguous axial images were obtained from the base of the skull through the vertex without intravenous contrast.  COMPARISON:  None.  FINDINGS: Technically limited study due to motion artifact. Ventricles and sulci appear symmetrical. No mass effect or midline shift. No abnormal extra-axial fluid collections. Gray-white matter junctions are distinct. Basal cisterns are not effaced. No evidence of acute intracranial hemorrhage. No depressed skull fractures. Visualized paranasal sinuses and mastoid air cells are not opacified. Vascular calcifications.  IMPRESSION: No acute intracranial abnormalities.   Electronically Signed   By: Burman Nieves M.D.   On: 12/31/2013 02:02   Ct Abdomen Pelvis W Contrast  12/31/2013   CLINICAL DATA:  Abdominal pain.  EXAM: CT ABDOMEN AND PELVIS WITH CONTRAST  TECHNIQUE: Multidetector CT imaging of the abdomen and pelvis was performed using the standard protocol following bolus administration of intravenous contrast.  CONTRAST:  80mL OMNIPAQUE IOHEXOL 300 MG/ML SOLN, 50mL OMNIPAQUE IOHEXOL 300 MG/ML SOLN  COMPARISON:  02/16/2011  FINDINGS: Atelectasis in the lung bases. Suggestion of small focal area of infiltration in  the right lung base. Coronary artery calcifications.  Diffuse fatty infiltration of the liver with focal more prominent fatty infiltration along the falciform ligament. The gallbladder, spleen, pancreas, adrenal glands, abdominal aorta, inferior vena cava, and retroperitoneal lymph nodes are unremarkable. Subcentimeter  parenchymal cysts in the kidneys. No hydronephrosis or hydroureter. Small accessory spleen. Stomach and small bowel are normal for degree of distention. Scattered stool in the colon without distention or wall thickening. No free air or free fluid in the abdomen.  Pelvis: The appendix is normal. No evidence of diverticulitis. Bladder wall is not thickened. Prostate gland is not enlarged. No free or loculated pelvic fluid collections. No pelvic mass or lymphadenopathy. Spondylolysis with minimal spondylolisthesis at L4-5. No destructive bone lesions.  IMPRESSION: No focal acute process demonstrated in the abdomen or pelvis. Atelectasis in the lung bases with suggestion of small focal infiltration in the right lung base.   Electronically Signed   By: Burman Nieves M.D.   On: 12/31/2013 03:29   2:26 AM Patient now more alert. He is drinking his contrast for CT scan without difficulty.  4:27 AM Dr. Toniann Fail accepts patient for transfer to [facility]. Will start antibiotics for possible early pneumonia. Altered mental status may be due to recent cocaine binge or other intoxicant. Patient is not lymphopenic, so patient's immune status remains to be determined (HIV studies ordered).    Hanley Seamen, MD 12/31/13 [phone]

## 2013-12-31 NOTE — ED Notes (Signed)
Phone number for medical unit: [phone]

## 2013-12-31 NOTE — ED Notes (Signed)
Notes reports abdominal pain with nausea and vomiting. Pt slow to answer questions and lethargic. Pt accompanied with [facility]

## 2013-12-31 NOTE — Progress Notes (Signed)
ANTIBIOTIC CONSULT NOTE - INITIAL  Pharmacy Consult for Ceftriaxone Indication: pneumonia  No Known Allergies  Patient Measurements: Height:  (165.1 cm) Weight: 165 lb (74.844 kg) IBW/kg (Calculated) : 61.5  Vital Signs: Temp: 98.7 F (37.1 C) (09/07 1610) Temp src: Oral (09/07 0611) BP: 156/75 mmHg (09/07 0611) Pulse Rate: 62 (09/07 0611) Intake/Output from previous day:   Intake/Output from this shift:    Labs:  Recent Labs  12/31/13 0030  WBC 12.4*  HGB 17.2*  PLT 257  CREATININE 1.60*   Estimated Creatinine Clearance: 51.6 ml/min (by C-G formula based on Cr of 1.6). No results found for this basename: VANCOTROUGH, VANCOPEAK, VANCORANDOM, GENTTROUGH, GENTPEAK, GENTRANDOM, TOBRATROUGH, TOBRAPEAK, TOBRARND, AMIKACINPEAK, AMIKACINTROU, AMIKACIN,  in the last 72 hours   Microbiology: No results found for this or any previous visit (from the past 720 hour(s)).  Medical History: Past Medical History  Diagnosis Date  . HIV (human immunodeficiency virus infection)     Medications:  No prescriptions prior to admission   Assessment: 53 y.o. male presents with AMS, N/V. To begin ceftriaxone and azithromycin for CAP.  Goal of Therapy:  Resolution of infection  Plan:  1. Ceftriaxone 1gm IV q24h. 2. Pharmacy will sign off as no renal dose adjustments needed - please reconsult if needed.  Christoper Fabian, PharmD, BCPS Clinical pharmacist, pager [phone] 12/31/2013,7:08 AM

## 2013-12-31 NOTE — Progress Notes (Addendum)
Patient seen and examined  Agree with Eduard Clos, MD assessment and plan  Subjective Patient has a muffled speech, and is oriented Following commands Opening his eyes been requested Participating in physical exam   General: CONFUSED,SLURED SPEECH.  Eyes: Anicteric no pallor.  ENT: No discharge from the ears eyes nose mouth.  Neck: No mass felt. No neck rigidity.  Cardiovascular: S1-S2 heard.  Respiratory: No rhonchi or crepitations.  Abdomen: Soft nontender bowel sounds present. No guarding rigidity.  Skin: No rash.     1. Acute encephalopathy - primarily suspect any drug withdrawal, benzodiazepines?. Check MRI brain and EEG and ammonia levels. At this time I have placed patient on when necessary Ativan withdrawal protocol. Patient is incarcerated. If no improvement in the next 24 hours we'll request infectious disease consult. May  also need a lumbar puncture if workup is completely negative given history of HIV 2. Nausea vomiting - CT abdomen and is unremarkable. Closely observe. 3. Possible pneumonia - could be aspiration related. Patient has been empirically placed on ceftriaxone and Zithromax. Since patient has history of HIV check LDH. Patient is not hypoxic. 4. History of HIV - need to find further details on this once patient is more alert and awake. Patient's HIV viral load and CD4 count has been ordered. Have requested his home medication list from jail 5. Renal failure likely acute-no baseline labs available. Check urine studies. Closely follow metabolic panel. For now continue to hydrate. 6. Polysubstance abuse - social work consult.   CALLED BY MRI THAT PATIENT HAS A CVA AND WOULD LIKE TO DO MRA REQUESTED NEUROLOGY CONSULT, RECTAL ASA

## 2013-12-31 NOTE — ED Notes (Addendum)
Pt presenting with garbled, slurred speech. Accompanying officers only able to give limited information. Called medical unit at jail, spoke with [facility]. Sts that pt was last seen "normal" yesterday morning (Saturday).  Sts the pt fell 3 times yesterday. Also sts the pt had some ensure on Sunday morning, but just a small amount. Reports the pt hasn't eaten or drank since Saturday. Reports pt c/o vomiting and constipation. Sts that pt has been reported taking opiates, benzos, and alcohol. Jail sent KUB results from yesterday showing: ileus pattern.
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
## 2014-01-01 ENCOUNTER — Inpatient Hospital Stay (HOSPITAL_COMMUNITY): Payer: PRIVATE HEALTH INSURANCE

## 2014-01-01 DIAGNOSIS — R1115 Cyclical vomiting syndrome unrelated to migraine: Secondary | ICD-10-CM

## 2014-01-01 DIAGNOSIS — I6789 Other cerebrovascular disease: Secondary | ICD-10-CM

## 2014-01-01 LAB — CBC
HCT: 39.7 % (ref 39.0–52.0)
HEMOGLOBIN: 14.4 g/dL (ref 13.0–17.0)
MCH: 30.8 pg (ref 26.0–34.0)
MCHC: 36.3 g/dL — ABNORMAL HIGH (ref 30.0–36.0)
MCV: 84.8 fL (ref 78.0–100.0)
Platelets: 204 10*3/uL (ref 150–400)
RBC: 4.68 MIL/uL (ref 4.22–5.81)
RDW: 12.9 % (ref 11.5–15.5)
WBC: 6.8 10*3/uL (ref 4.0–10.5)

## 2014-01-01 LAB — LIPID PANEL
CHOL/HDL RATIO: 3.9 ratio
Cholesterol: 221 mg/dL — ABNORMAL HIGH (ref 0–200)
HDL: 56 mg/dL (ref >39)
LDL Cholesterol: 148 mg/dL — ABNORMAL HIGH (ref 0–99)
Triglycerides: 85 mg/dL (ref <150)
VLDL: 17 mg/dL (ref 0–40)

## 2014-01-01 LAB — BASIC METABOLIC PANEL
Anion gap: 10 (ref 5–15)
BUN: 9 mg/dL (ref 6–23)
CHLORIDE: 102 meq/L (ref 96–112)
CO2: 24 mEq/L (ref 19–32)
Calcium: 8.6 mg/dL (ref 8.4–10.5)
Creatinine, Ser: 1.43 mg/dL — ABNORMAL HIGH (ref 0.50–1.35)
GFR calc non Af Amer: 55 mL/min — ABNORMAL LOW (ref >90)
GFR, EST AFRICAN AMERICAN: 64 mL/min — AB (ref >90)
Glucose, Bld: 96 mg/dL (ref 70–99)
Potassium: 3.9 mEq/L (ref 3.7–5.3)
Sodium: 136 mEq/L — ABNORMAL LOW (ref 137–147)

## 2014-01-01 LAB — HEMOGLOBIN A1C
Hgb A1c MFr Bld: 5.7 % — ABNORMAL HIGH (ref <5.7)
Mean Plasma Glucose: 117 mg/dL — ABNORMAL HIGH (ref <117)

## 2014-01-01 LAB — STREP PNEUMONIAE URINARY ANTIGEN: STREP PNEUMO URINARY ANTIGEN: NEGATIVE

## 2014-01-01 MED ORDER — ALUM & MAG HYDROXIDE-SIMETH 200-200-20 MG/5ML PO SUSP
30.0000 mL | ORAL | Status: DC | PRN
  Administered 2014-01-01: 30 mL via ORAL
  Filled 2014-01-01: qty 30

## 2014-01-01 MED ORDER — DARUNAVIR-COBICISTAT 800-150 MG PO TABS
1.0000 | ORAL_TABLET | Freq: Every day | ORAL | Status: DC
  Administered 2014-01-02 – 2014-01-03 (×2): 1 via ORAL
  Filled 2014-01-01 (×3): qty 1

## 2014-01-01 NOTE — Progress Notes (Signed)
STROKE TEAM PROGRESS NOTE   HISTORY Tony Whitaker is an 53 y.o. male with a history of hypertension, myocardial infarction, migraine headaches, HIV infection and chronic low back pain, admitted today for management of altered mental status with associated nausea and vomiting. CT scan of his head was unremarkable. Urine drug screen was positive for cocaine, benzodiazepines and marijuana. MRI showed an 8 mm weakly diffusion restricted lesion involving the left centrum semiovale. Lesion was somewhat age indeterminate. Subacute infarction cannot be ruled out. MRA showed no large vessel occlusion/severe stenosis.  NIH stroke score was 1 with persistent confusion. LSN: Unknown. Rankin: 1.  Patient was not administered TPA secondary to no focal deficits and unknown when last seen well. He was admitted for further evaluation and treatment.   SUBJECTIVE (INTERVAL HISTORY) His  Corrections officer is at the bedside.  Overall he feels his condition is unchanged.  He denies focal extremity weakness, numbness, dysarthria. He states his balance is off and he cannot stand straight and tends to fall backwards. He is has also had vomiting and diarrhea. His symptoms apparently have occurred only in the last 2-3 days. He denies significant headache, blurred vision ,double vision or vertigo   OBJECTIVE Temp:  [97.6 F (36.4 C)-98.5 F (36.9 C)] 97.6 F (36.4 C) (09/08 1200) Pulse Rate:  [56-62] 62 (09/08 1200) Cardiac Rhythm:  [-]  Resp:  [16] 16 (09/08 1200) BP: (146-158)/(78-96) 156/83 mmHg (09/08 1200) SpO2:  [96 %-100 %] 98 % (09/08 1200)   Recent Labs Lab 12/31/13 0114  GLUCAP 102*    Recent Labs Lab 12/31/13 0030 12/31/13 0636 01/01/14 0754  NA 135* 132* 136*  K 4.0 3.9 3.9  CL 95* 97 102  CO2 GLUCOSE 118* 116* 96  BUN CREATININE 1.60* 1.41* 1.43*  CALCIUM 10.5 8.9 8.6    Recent Labs Lab 12/31/13 0030 12/31/13 0636  AST 25 18  ALT 19 16  ALKPHOS 59 53  BILITOT  0.7 0.4  PROT 10.1* 8.4*  ALBUMIN 4.0 3.5    Recent Labs Lab 12/31/13 0030 12/31/13 0636 01/01/14 0754  WBC 12.4* 10.3 6.8  NEUTROABS 8.6* 6.7  --   HGB 17.2* 15.3 14.4  HCT 48.0 42.2 39.7  MCV 85.4 86.3 84.8  PLT 257 216 204   No results found for this basename: CKTOTAL, CKMB, CKMBINDEX, TROPONINI,  in the last 168 hours No results found for this basename: LABPROT, INR,  in the last 72 hours  Recent Labs  12/31/13 0155  COLORURINE YELLOW  LABSPEC 1.014  PHURINE 6.0  GLUCOSEU NEGATIVE  HGBUR MODERATE*  BILIRUBINUR NEGATIVE  KETONESUR 15*  PROTEINUR 100*  UROBILINOGEN 0.2  NITRITE NEGATIVE  LEUKOCYTESUR NEGATIVE    No results found for this basename: chol, trig, hdl, cholhdl, vldl, ldlcalc   No results found for this basename: HGBA1C      Component Value Date/Time   LABOPIA NONE DETECTED 12/31/2013 0155   COCAINSCRNUR POSITIVE* 12/31/2013 0155   LABBENZ POSITIVE* 12/31/2013 0155   AMPHETMU NONE DETECTED 12/31/2013 0155   THCU POSITIVE* 12/31/2013 0155   LABBARB NONE DETECTED 12/31/2013 0155    No results found for this basename: ETH,  in the last 168 hours  Dg Chest 2 View 12/31/2013   Diffuse interstitial process in the lungs suggesting interstitial pneumonitis.     Ct Head Wo Contrast 12/31/2013   No acute intracranial abnormalities.     MRI & MRA Brain Wo Contrast 12/31/2013  1. No acute or subacute large territory infarct. 2. 8 mm peripherally diffusion restricting lesion in the left centrum semiovale, indeterminate. Considerations include subacute infarct, opportunistic infection, demyelination, and metastasis. Postcontrast imaging may be helpful for further evaluation. Of note, there is no significant edema surrounding this lesion. 3. Several additional foci of white matter T2 signal abnormality, nonspecific. Considerations include mild chronic small vessel ischemic disease, sequelae of infection, demyelination, vasculitis, and trauma. 4. Motion degraded MRA.  No major  intracranial arterial occlusion. 5. Apparent moderate to severe narrowing of the proximal to mid basilar artery is felt to be at least largely in part artifactual due to motion. No significant proximal stenosis is seen in the anterior circulation. Mild-to-moderate anterior and posterior circulation branch vessel irregularity. 6. 1.5 mm infundibulum versus tiny aneurysm of the distal left ICA.     Ct Abdomen Pelvis W Contrast 12/31/2013   No focal acute process demonstrated in the abdomen or pelvis. Atelectasis in the lung bases with suggestion of small focal infiltration in the right lung base.   Electronically Signed   By: Burman Nieves M.D.   On: 12/31/2013 03:29    PHYSICAL EXAM Frail middle aged african [place] male not in distress.Awake alert. Afebrile. Head is nontraumatic. Neck is supple without bruit. Hearing is normal. Cardiac exam no murmur or gallop. Lungs are clear to auscultation. Distal pulses are well felt. Neurological Exam : Awake alert oriented to person and place only. Speech appears fluent without dysarthria. Follows simple one and two-step commands. Extraocular movements are full range without nystagmus. Blinks to threat bilaterally. Vision acuity seems adequate. Fundi were not visualized. No facial weakness. Tongue is midline. Able to move all 4 extremities well against gravity. No focal weakness. Poor truncal balance at times tends to rock backwards at times he can stand with good balance but tends to lean forward. Gait wide-based but effort seems variable.  ASSESSMENT/PLAN ;  [name] is a 53 y.o. male with history of hypertension, myocardial infarction, migraine headaches, HIV infection and chronic low back pain, admitted today for management of altered mental status with associated nausea and vomiting but no focal neurological deficits. Neurological exam is significant on leave for labile truncal ataxia but I doubt his cooperation. CT scan of his head was  unremarkable. Urine drug screen was positive for cocaine, benzodiazepines and marijuana. MRI showed an 8 mm weakly diffusion restricted lesion without ADC correlate involving the left centrum semiovale. Lesion ist age indeterminate with a wide differential diagnosis not necessarily stroke patient's present clinical symptoms are not compatible with this lesion. He did not receive IV t-PA due to No focal deficits and unknown when last seen well. Imaging negative for acute infarct.    no antithrombotics prior to admission, now on aspirin 300 mg suppository  MRI  No acute infarct. Recommend repeat limited MRI scan with thin coronals sections through the brain stem to look for   tiny infarct not seen on the regular MRI  MRA  No large vessel stenosis  Carotid Doppler  pending   2D Echo  pending    LDL pending   HgbA1c pending   Lovenox 40 mg sq daily or VTE prophylaxis  General thin liquids.   Up with assistance  Therapy needs:  Unable to receive as incarerated  Ongoing aggressive risk factor management  Risk factor education  Disposition:  Return to jail  Other Stroke Risk Factors HIV hypertension  Cigarette smoker, advised to stop smoking ETOH use   MI  Migraines  Other Pertinent History  Currently incarcerated  Hospital day # 1   I have personally examined this patient, reviewed notes, independently viewed imaging studies, participated in medical decision making and plan of care. I have made any additions or clarifications directly to the above note. Agree with note above.  Recommend repeat limited MRI scan with thin coronals sections through the brain stem to look for   tiny infarct not seen on the regular MRI   Delia Heady, MD Medical Director [facility] Pager: [phone] 01/01/2014 4:38 PM     To contact Stroke Continuity provider, please refer to [email or web address]. After hours, contact General Neurology

## 2014-01-01 NOTE — Evaluation (Signed)
Physical Therapy Evaluation Patient Details Name: Tony Whitaker MRN: [id number] DOB: Jun 30, 1960 Today's Date: 01/01/2014   History of Present Illness  Tony Whitaker is a 53 y.o. male history of HIV, was [facility] because patient was having altered mental status and nausea vomiting. As per the ER physician from whom I obtained most of the history patient has been recently arrested and was in jail on September 3. Following which patient became more confused and nausea vomiting and has not been eating well. Patient was brought to the ER. In the ER CT head and abdomen and pelvis unremarkable. Labs revealed acute renal failure. Drug screen positive for cocaine, marijuana and benzo.MRI showed an 8 mm diffusion restricted lesion involving the left centrum semiovale. Lesion was somewhat age indeterminate. Subacute infarction cannot be ruled out   Clinical Impression  Pt with flat affect, impaired balance, gait and strength. Pt with unsteady gait improved with RW and recommend RW and minguard for gait currently. Pt will benefit from acute therapy to maximize mobility, strength, function and balance to decrease burden of care and increase pt safety for return to jail.     Follow Up Recommendations  (pt unable to receive therapy in jail)    Equipment Recommendations  Rolling walker with 5" wheels (jail can provide)    Recommendations for Other Services       Precautions / Restrictions Precautions Precautions: Fall Precaution Comments: handcuffs and shackles      Mobility  Bed Mobility Overal bed mobility: Modified Independent                Transfers Overall transfer level: Needs assistance   Transfers: Sit to/from Stand Sit to Stand: Min guard         General transfer comment: guarding for safety with impaired balance  Ambulation/Gait Ambulation/Gait assistance: Min assist Ambulation Distance (Feet): 60 Feet Assistive device: Rolling walker  (2 wheeled);None Gait Pattern/deviations: Step-through pattern;Shuffle;Narrow base of support;Trunk flexed;Staggering right;Staggering left   Gait velocity interpretation: Below normal speed for age/gender General Gait Details: Without RW pt with very unsteady gait with assist to maintain balance and prevent fall x 2. With RW minguard assist with increased stride, no LOB and improved posture. Recommend RW at all times for gait currently and discussed with guard in room who states jail can provide  Stairs            Wheelchair Mobility    Modified Rankin (Stroke Patients Only) Modified Rankin (Stroke Patients Only) Pre-Morbid Rankin Score: No symptoms Modified Rankin: Moderate disability     Balance Overall balance assessment: Needs assistance   Sitting balance-Leahy Scale: Good       Standing balance-Leahy Scale: Poor   Single Leg Stance - Right Leg: 5 Single Leg Stance - Left Leg: 5 Tandem Stance - Right Leg: 6 Tandem Stance - Left Leg: 4   Rhomberg - Eyes Closed: 20                 Pertinent Vitals/Pain Pain Assessment: 0-10 Pain Score: 3  Pain Location: abdomen Pain Descriptors / Indicators: Cramping Pain Intervention(s): Repositioned    Home Living Family/patient expects to be discharged to:: [facility]                      Prior Function Level of Independence: Independent         Comments: Pt was functioning on his own does report at least 2 recent falls  Hand Dominance        Extremity/Trunk Assessment   Upper Extremity Assessment: Overall WFL for tasks assessed           Lower Extremity Assessment: RLE deficits/detail;LLE deficits/detail RLE Deficits / Details: hip flexion 3/5, knee extension 4/5, knee flexion 3/5 LLE Deficits / Details: 5/5 all myotomes  Cervical / Trunk Assessment: Normal  Communication   Communication: No difficulties  Cognition Arousal/Alertness: Awake/alert Behavior During Therapy:  Flat affect Overall Cognitive Status: Impaired/Different from baseline Area of Impairment: Safety/judgement         Safety/Judgement: Decreased awareness of deficits;Decreased awareness of safety          General Comments      Exercises        Assessment/Plan    PT Assessment Patient needs continued PT services  PT Diagnosis Abnormality of gait   PT Problem List Decreased activity tolerance;Decreased balance;Decreased knowledge of use of DME;Decreased cognition;Decreased mobility  PT Treatment Interventions Gait training;DME instruction;Balance training;Neuromuscular re-education;Functional mobility training;Therapeutic activities;Therapeutic exercise;Patient/family education   PT Goals (Current goals can be found in the Care Plan section) Acute Rehab PT Goals Patient Stated Goal: be able to walk right PT Goal Formulation: With patient Time For Goal Achievement: 01/15/14 Potential to Achieve Goals: Fair    Frequency Min 3X/week   Barriers to discharge Decreased caregiver support      Co-evaluation               End of Session Equipment Utilized During Treatment: Gait belt Activity Tolerance: Patient tolerated treatment well Patient left: in chair;with call bell/phone within reach;with restraints reapplied;Other (comment) (2 guards present at door, RN denied need for chair alarm)           Time: 1610-9604 PT Time Calculation (min): 20 min   Charges:   PT Evaluation $Initial PT Evaluation Tier I: 1 Procedure PT Treatments $Therapeutic Activity: 8-22 mins   PT G Codes:          Delorse Lek 01/01/2014, 8:55 AM Delaney Meigs, PT [phone]

## 2014-01-01 NOTE — Progress Notes (Signed)
EEG completed, results pending. 

## 2014-01-01 NOTE — Progress Notes (Signed)
[facility] PROGRESS NOTE  Mat Stuard ZOX:[id number] DOB: 1961-02-01 DOA: 12/31/2013 PCP: No PCP Per Patient  Assessment/Plan: Principal Problem:   Acute encephalopathy Active Problems:   Nausea & vomiting   Renal failure   Abnormal finding on MRI of brain    1. 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 patient has been evaluated by neurology. PT/OT/speech. Carotid Doppler 2-D echo pending, lipid panel, hemoglobin A1c. Currently in a splint 2. Nausea vomiting - CT abdomen and is unremarkable. Closely observe.    3. Possible pneumonia - could be aspiration related. Patient has been empirically placed on ceftriaxone and Zithromax. .  Patient is not hypoxic. 4. History of HIV -  patient sees Dr. Reginold Agent in [place] . Patient's HIV viral load and CD4 count has been ordered.  resumed home medications for HIV  5. Renal failure likely acute-no baseline labs available.  no significant improvement with IV fluids.   may need outpatient renal evaluation for HIV nephrosclerosis  6. Polysubstance abuse - social work consult.     Code Status: full Family Communication: family updated about patient's clinical progress Disposition Plan:  anticipate discharge tomorrow and workup is completed    Brief narrative: Sheamus Hasting is a 53 y.o. male history of HIV, was brought from [facility] because patient was having altered mental status and nausea vomiting. As per the ER physician from whom I obtained most of the history patient has been recently arrested and was in jail on September 3. Following which patient became more confused and nausea vomiting and has not been eating well. Patient was brought to the ER. In the ER CT head and abdomen and pelvis unremarkable. Labs  revealed acute renal failure. No old labs to compare with. Patient on my exam is lethargic and not providing much history. Follows commands and moves all extremities. Patient's drug screen is positive for cocaine benzo and marijuana.   Consultants: Neurology  Procedures: None  Antibiotics:  None  HPI/Subjective: More awake and alert  Objective: Filed Vitals:   12/31/13 1200 12/31/13 1833 01/01/14 0013 01/01/14 0510  BP: 152/86 158/96 155/78 146/79  Pulse: 57 62 56 58  Temp: 99 F (37.2 C) 97.7 F (36.5 C) 98.4 F (36.9 C) 98.5 F (36.9 C)  TempSrc: Oral Oral Oral Oral  Resp: Height:      Weight:      SpO2: 100% 96% 99% 100%    Intake/Output Summary (Last 24 hours) at 01/01/14 1257 Last data filed at 12/31/13 1906  Gross per 24 hour  Intake    120 ml  Output    400 ml  Net   -280 ml    Exam:  General: alert & oriented x 3 In NAD  Cardiovascular: RRR, nl S1 s2  Respiratory: Decreased breath sounds at the bases, scattered rhonchi, no crackles  Abdomen: soft +BS NT/ND, no masses palpable  Extremities: No cyanosis and no edema      Data Reviewed: Basic Metabolic Panel:  Recent Labs Lab 12/31/13 0030 12/31/13 0636 01/01/14 0754  NA 135* 132* 136*  K 4.0 3.9 3.9  CL 95* 97 102  CO2 GLUCOSE 118* 116* 96  BUN CREATININE 1.60* 1.41* 1.43*  CALCIUM 10.5 8.9 8.6  Liver Function Tests:  Recent Labs Lab 12/31/13 0030 12/31/13 0636  AST 25 18  ALT 19 16  ALKPHOS 59 53  BILITOT 0.7 0.4  PROT 10.1* 8.4*  ALBUMIN 4.0 3.5    Recent Labs Lab 12/31/13 0030  LIPASE 22    Recent Labs Lab 12/31/13 0710  AMMONIA 29    CBC:  Recent Labs Lab 12/31/13 0030 12/31/13 0636 01/01/14 0754  WBC 12.4* 10.3 6.8  NEUTROABS 8.6* 6.7  --   HGB 17.2* 15.3 14.4  HCT 48.0 42.2 39.7  MCV 85.4 86.3 84.8  PLT 257 216 204    Cardiac Enzymes: No results found for this basename: CKTOTAL, CKMB, CKMBINDEX, TROPONINI,  in  the last 168 hours BNP (last 3 results) No results found for this basename: PROBNP,  in the last 8760 hours   CBG:  Recent Labs Lab 12/31/13 0114  GLUCAP 102*    Recent Results (from the past 240 hour(s))  MRSA PCR SCREENING     Status: Abnormal   Collection Time    12/31/13  6:36 AM      Result Value Ref Range Status   MRSA by PCR POSITIVE (*) NEGATIVE Final   Comment:            The GeneXpert MRSA Assay (FDA     approved for NASAL specimens     only), is one component of a     comprehensive MRSA colonization     surveillance program. It is not     intended to diagnose MRSA     infection nor to guide or     monitor treatment for     MRSA infections.     RESULT CALLED TO, READ BACK BY AND VERIFIED WITH:     [facility] RN 10:25 12/31/13 ([email or web address])     Studies: Dg Chest 2 View  12/31/2013   CLINICAL DATA:  Cough.  HIV.  EXAM: CHEST  2 VIEW  COMPARISON:  03/17/2013  FINDINGS: Normal heart size and pulmonary vascularity. Interstitial changes in both lungs, possibly representing interstitial pneumonitis or fibrosis. No focal consolidation or airspace disease. No blunting of costophrenic angles. No pneumothorax. Tortuous aorta.  IMPRESSION: Diffuse interstitial process in the lungs suggesting interstitial pneumonitis.   Electronically Signed   By: Burman Nieves M.D.   On: 12/31/2013 01:51   Ct Head Wo Contrast  12/31/2013   CLINICAL DATA:  Altered mental status normal and slurred speech. Multiple falls yesterday. Vomiting and constipation.  EXAM: CT HEAD WITHOUT CONTRAST  TECHNIQUE: Contiguous axial images were obtained from the base of the skull through the vertex without intravenous contrast.  COMPARISON:  None.  FINDINGS: Technically limited study due to motion artifact. Ventricles and sulci appear symmetrical. No mass effect or midline shift. No abnormal extra-axial fluid collections. Gray-white matter junctions are distinct. Basal cisterns are not effaced. No evidence of acute  intracranial hemorrhage. No depressed skull fractures. Visualized paranasal sinuses and mastoid air cells are not opacified. Vascular calcifications.  IMPRESSION: No acute intracranial abnormalities.   Electronically Signed   By: Burman Nieves M.D.   On: 12/31/2013 02:02   Mr Maxine Glenn Head Wo Contrast  12/31/2013   CLINICAL DATA:  Altered mental status.  HIV.  EXAM: MRI HEAD WITHOUT CONTRAST  MRA HEAD WITHOUT CONTRAST  TECHNIQUE: Multiplanar, multiecho pulse sequences of the brain and surrounding structures were obtained without intravenous contrast. Angiographic images of the head were obtained using MRA technique without contrast.  COMPARISON:  Head CT 12/31/2013  FINDINGS: MRI HEAD FINDINGS  Images are mildly to moderately degraded by motion artifact. Gradient echo/susceptibility weighted images were not obtained due to patient's inability to cooperate with the examination.  There is an 8 mm T2 hyperintense focus in the left centrum semiovale which demonstrates a peripheral ring of restricted diffusion with evidence of facilitated diffusion centrally. No other restricted diffusion is seen elsewhere to indicate acute infarct. No gross intracranial hemorrhage is seen. There is no midline shift or extra-axial fluid collection. Several small, scattered foci of T2 hyperintensity are present elsewhere in the subcortical and deep cerebral white matter. Ventricles and sulci are within normal limits for age.  Orbits are unremarkable. Paranasal sinuses and mastoid air cells are clear. Major intracranial vascular flow voids are preserved.  MRA HEAD FINDINGS  Images are mildly to moderately degraded by motion artifact.  Visualized distal vertebral arteries are patent and codominant. PICA origins are patent. Basilar artery is patent. There is apparent moderate to severe irregular narrowing of the proximal and mid basilar artery, although evaluation is limited by motion through this area and the basilar is relatively normal in  appearance on whole brain T2 weighted images. Distal basilar artery is unremarkable. SCA origins are patent. There are fetal type origins of the PCAs bilaterally, with the left P1 segment being hypoplastic and the right P1 segment likely absent. No high-grade proximal PCA stenosis is seen. There is mild-to-moderate PCA branch vessel irregularity.  Internal carotid arteries are patent from skullbase to carotid termini without evidence significant stenosis. There is a 1.5 mm posteriorly directed outpouching from the distal left supraclinoid ICA. Right A1 segment is either severely hypoplastic or absent. The right A2 is supplied by the left ACA via the anterior communicating artery. No proximal MCA stenosis is seen. There is mild-to-moderate bilateral MCA branch vessel irregular narrowing.  IMPRESSION: 1. No acute or subacute large territory infarct. 2. 8 mm peripherally diffusion restricting lesion in the left centrum semiovale, indeterminate. Considerations include subacute infarct, opportunistic infection, demyelination, and metastasis. Postcontrast imaging may be helpful for further evaluation. Of note, there is no significant edema surrounding this lesion. 3. Several additional foci of white matter T2 signal abnormality, nonspecific. Considerations include mild chronic small vessel ischemic disease, sequelae of infection, demyelination, vasculitis, and trauma. 4. Motion degraded MRA.  No major intracranial arterial occlusion. 5. Apparent moderate to severe narrowing of the proximal to mid basilar artery is felt to be at least largely in part artifactual due to motion. No significant proximal stenosis is seen in the anterior circulation. Mild-to-moderate anterior and posterior circulation branch vessel irregularity. 6. 1.5 mm infundibulum versus tiny aneurysm of the distal left ICA.   Electronically Signed   By: Sebastian Ache   On: 12/31/2013 16:30   Mr Brain Wo Contrast  12/31/2013   CLINICAL DATA:  Altered  mental status.  HIV.  EXAM: MRI HEAD WITHOUT CONTRAST  MRA HEAD WITHOUT CONTRAST  TECHNIQUE: Multiplanar, multiecho pulse sequences of the brain and surrounding structures were obtained without intravenous contrast. Angiographic images of the head were obtained using MRA technique without contrast.  COMPARISON:  Head CT 12/31/2013  FINDINGS: MRI HEAD FINDINGS  Images are mildly to moderately degraded by motion artifact. Gradient echo/susceptibility weighted images were not obtained due to patient's inability to cooperate with the examination.  There is an 8 mm T2 hyperintense focus in the left centrum semiovale which demonstrates a peripheral ring of restricted diffusion with evidence of facilitated diffusion centrally. No other restricted diffusion is seen  elsewhere to indicate acute infarct. No gross intracranial hemorrhage is seen. There is no midline shift or extra-axial fluid collection. Several small, scattered foci of T2 hyperintensity are present elsewhere in the subcortical and deep cerebral white matter. Ventricles and sulci are within normal limits for age.  Orbits are unremarkable. Paranasal sinuses and mastoid air cells are clear. Major intracranial vascular flow voids are preserved.  MRA HEAD FINDINGS  Images are mildly to moderately degraded by motion artifact.  Visualized distal vertebral arteries are patent and codominant. PICA origins are patent. Basilar artery is patent. There is apparent moderate to severe irregular narrowing of the proximal and mid basilar artery, although evaluation is limited by motion through this area and the basilar is relatively normal in appearance on whole brain T2 weighted images. Distal basilar artery is unremarkable. SCA origins are patent. There are fetal type origins of the PCAs bilaterally, with the left P1 segment being hypoplastic and the right P1 segment likely absent. No high-grade proximal PCA stenosis is seen. There is mild-to-moderate PCA branch vessel  irregularity.  Internal carotid arteries are patent from skullbase to carotid termini without evidence significant stenosis. There is a 1.5 mm posteriorly directed outpouching from the distal left supraclinoid ICA. Right A1 segment is either severely hypoplastic or absent. The right A2 is supplied by the left ACA via the anterior communicating artery. No proximal MCA stenosis is seen. There is mild-to-moderate bilateral MCA branch vessel irregular narrowing.  IMPRESSION: 1. No acute or subacute large territory infarct. 2. 8 mm peripherally diffusion restricting lesion in the left centrum semiovale, indeterminate. Considerations include subacute infarct, opportunistic infection, demyelination, and metastasis. Postcontrast imaging may be helpful for further evaluation. Of note, there is no significant edema surrounding this lesion. 3. Several additional foci of white matter T2 signal abnormality, nonspecific. Considerations include mild chronic small vessel ischemic disease, sequelae of infection, demyelination, vasculitis, and trauma. 4. Motion degraded MRA.  No major intracranial arterial occlusion. 5. Apparent moderate to severe narrowing of the proximal to mid basilar artery is felt to be at least largely in part artifactual due to motion. No significant proximal stenosis is seen in the anterior circulation. Mild-to-moderate anterior and posterior circulation branch vessel irregularity. 6. 1.5 mm infundibulum versus tiny aneurysm of the distal left ICA.   Electronically Signed   By: Sebastian Ache   On: 12/31/2013 16:30   Ct Abdomen Pelvis W Contrast  12/31/2013   CLINICAL DATA:  Abdominal pain.  EXAM: CT ABDOMEN AND PELVIS WITH CONTRAST  TECHNIQUE: Multidetector CT imaging of the abdomen and pelvis was performed using the standard protocol following bolus administration of intravenous contrast.  CONTRAST:  80mL OMNIPAQUE IOHEXOL 300 MG/ML SOLN, 50mL OMNIPAQUE IOHEXOL 300 MG/ML SOLN  COMPARISON:  02/16/2011   FINDINGS: Atelectasis in the lung bases. Suggestion of small focal area of infiltration in the right lung base. Coronary artery calcifications.  Diffuse fatty infiltration of the liver with focal more prominent fatty infiltration along the falciform ligament. The gallbladder, spleen, pancreas, adrenal glands, abdominal aorta, inferior vena cava, and retroperitoneal lymph nodes are unremarkable. Subcentimeter parenchymal cysts in the kidneys. No hydronephrosis or hydroureter. Small accessory spleen. Stomach and small bowel are normal for degree of distention. Scattered stool in the colon without distention or wall thickening. No free air or free fluid in the abdomen.  Pelvis: The appendix is normal. No evidence of diverticulitis. Bladder wall is not thickened. Prostate gland is not enlarged. No free or loculated pelvic fluid collections. No pelvic mass  or lymphadenopathy. Spondylolysis with minimal spondylolisthesis at L4-5. No destructive bone lesions.  IMPRESSION: No focal acute process demonstrated in the abdomen or pelvis. Atelectasis in the lung bases with suggestion of small focal infiltration in the right lung base.   Electronically Signed   By: Burman Nieves M.D.   On: 12/31/2013 03:29    Scheduled Meds: . abacavir  600 mg Oral Daily   And  . lamiVUDine  300 mg Oral Daily  . aspirin  300 mg Rectal Daily  . azithromycin  500 mg Intravenous Q24H  . cefTRIAXone (ROCEPHIN)  IV  1 g Intravenous Q24H  . Chlorhexidine Gluconate Cloth  6 each Topical Q0600  . [START ON 01/02/2014] darunavir-cobicistat  1 tablet Oral Q breakfast  . enoxaparin (LOVENOX) injection  40 mg Subcutaneous Q24H  . folic acid  1 mg Oral Daily  . multivitamin with minerals  1 tablet Oral Daily  . mupirocin ointment  1 application Nasal BID  . sodium chloride  3 mL Intravenous Q12H  . thiamine  100 mg Oral Daily   Continuous Infusions:   Principal Problem:   Acute encephalopathy Active Problems:   Nausea & vomiting    Renal failure   Abnormal finding on MRI of brain    Time spent: 40 minutes   [facility]  [facility] Pager [phone]. If 7PM-7AM, please contact night-coverage at [email or web address], password [facility] 01/01/2014, 12:57 PM  LOS: 1 day

## 2014-01-01 NOTE — Evaluation (Signed)
Occupational Therapy Evaluation Patient Details Name: Tony Whitaker MRN: [id number] DOB: 01/11/1961 Today's Date: 01/01/2014    History of Present Illness Tony Whitaker is a 53 y.o. male history of HIV, was [facility] because patient was having altered mental status and nausea vomiting. As per the ER physician from whom I obtained most of the history patient has been recently arrested and was in jail on September 3. Following which patient became more confused and nausea vomiting and has not been eating well. Patient was brought to the ER. In the ER CT head and abdomen and pelvis unremarkable. Labs revealed acute renal failure. Drug screen positive for cocaine, marijuana and benzo.MRI showed an 8 mm diffusion restricted lesion involving the left centrum semiovale. Lesion was somewhat age indeterminate. Subacute infarction cannot be ruled out    Clinical Impression   Pt admitted with above. Pt with decreased balance during session while performing ADLs. Feel pt will benefit from acute OT to increase independence prior to d/c.      Follow Up Recommendations  Other (comment) (can't receive therapy in jail)    Equipment Recommendations  3 in 1 bedside comode    Recommendations for Other Services       Precautions / Restrictions Precautions Precautions: Fall Precaution Comments: handcuffs and shackles Restrictions Weight Bearing Restrictions: No      Mobility Bed Mobility Overal bed mobility: Modified Independent                Transfers Overall transfer level: Needs assistance   Transfers: Sit to/from Stand Sit to Stand: Min guard;Min assist         General transfer comment: cues for hand placement    Balance                                            ADL Overall ADL's : Needs assistance/impaired     Grooming: Oral care;Applying deodorant;Moderate assistance;Standing;Maximal assistance (balance)   Upper Body  Bathing: Standing;Minimal assitance   Lower Body Bathing: Moderate assistance (standing)   Upper Body Dressing : Set up;Sitting   Lower Body Dressing: Moderate assistance;Sit to/from stand;Minimal assistance   Toilet Transfer: Ambulation;BSC;Minimal assistance;RW   Toileting- Clothing Manipulation and Hygiene: Sit to/from stand;Sitting/lateral lean;Moderate assistance       Functional mobility during ADLs: Moderate assistance;Rolling walker;Minimal assistance General ADL Comments: Pt with decreased balance while standing at sink requiring Min-Max assist. Performed bathing/grooming.     Vision                     Perception     Praxis      Pertinent Vitals/Pain Pain Assessment: 0-10 Pain Score: 5  Pain Location: abdomen Pain Intervention(s): Monitored during session     Hand Dominance     Extremity/Trunk Assessment Upper Extremity Assessment Upper Extremity Assessment: Overall WFL for tasks assessed   Lower Extremity Assessment Lower Extremity Assessment: Defer to PT evaluation   Cervical / Trunk Assessment Cervical / Trunk Assessment: Normal   Communication Communication Communication: No difficulties   Cognition Arousal/Alertness: Awake/alert Behavior During Therapy: WFL for tasks assessed/performed Overall Cognitive Status: Impaired/Different from baseline Area of Impairment: Safety/judgement;Orientation Orientation Level: Disoriented to;Time;Place       Safety/Judgement: Decreased awareness of safety         General Comments       Exercises  Shoulder Instructions      Home Living Family/patient expects to be discharged to:: [facility]/Prison                                        Prior Functioning/Environment Level of Independence: Independent        Comments: Pt was functioning on his own does report at least 2 recent falls    OT Diagnosis: Cognitive deficits;Acute pain;Other (comment) (decreased  balance)   OT Problem List: Decreased cognition;Decreased safety awareness;Decreased knowledge of precautions;Pain;Decreased strength;Impaired balance (sitting and/or standing)   OT Treatment/Interventions: Self-care/ADL training;DME and/or AE instruction;Therapeutic activities;Patient/family education;Balance training;Cognitive remediation/compensation    OT Goals(Current goals can be found in the care plan section) Acute Rehab OT Goals Patient Stated Goal: not stated OT Goal Formulation: With patient Time For Goal Achievement: 01/08/14 Potential to Achieve Goals: Good ADL Goals Pt Will Perform Grooming: with modified independence;standing Pt Will Perform Lower Body Bathing: with modified independence;sit to/from stand Pt Will Perform Lower Body Dressing: with modified independence;sit to/from stand Pt Will Transfer to Toilet: with modified independence;ambulating;regular height toilet  OT Frequency: Min 2X/week   Barriers to D/C:            Co-evaluation              End of Session Equipment Utilized During Treatment: Gait belt;Rolling walker  Activity Tolerance: Patient tolerated treatment well Patient left: in bed;with call bell/phone within reach;Other (comment) Teacher, early years/pre)   Time: [phone] OT Time Calculation (min): 33 min (increased time spent on [facility]) Charges:  OT General Charges $OT Visit: 1 Procedure OT Evaluation $Initial OT Evaluation Tier I: 1 Procedure OT Treatments $Self Care/Home Management : 8-22 mins G-CodesEarlie Raveling OTR/L [phone] 01/01/2014, 1:14 PM

## 2014-01-01 NOTE — Progress Notes (Signed)
Utilization review completed.  

## 2014-01-01 NOTE — Progress Notes (Signed)
  Echocardiogram 2D Echocardiogram has been performed.  Leta Jungling M 01/01/2014, 4:16 PM

## 2014-01-01 NOTE — Procedures (Signed)
History: 53 year old male with altered mental status  Sedation: None, but the patient did receive Ativan earlier in the day  Technique: This is a 17 channel routine scalp EEG performed at the bedside with bipolar and monopolar montages arranged in accordance to the international 10/20 system of electrode placement. One channel was dedicated to EKG recording.    Background: The background consists of intermixed alpha and beta activities, but there is an excess of beta activity. There is a well defined posterior dominant rhythm of 10 Hz that attenuates with eye opening. With drowsiness, there is an increase in delta activity and sleep structures are observed with normal distribution.  Photic stimulation: Physiologic driving is not performed  EEG Abnormalities: None  Clinical Interpretation: This normal EEG is recorded in the waking and sleep state. There was no seizure or seizure predisposition recorded on this study.   Ritta Slot, MD [facility] [phone]  If 7pm- 7am, please page neurology on call as listed in AMION.

## 2014-01-01 NOTE — Progress Notes (Signed)
Pt called RN to room. Patient was crying stating that he "just wants someone to talk to." RN discussed patient's concerns over his health, the fact that he has been in and out of prison, and future plans. RN asked patient if he would like to speak with a  Chaplain. Patient stated that he would. Chaplain paged on patient's behalf.

## 2014-01-01 NOTE — Progress Notes (Signed)
Speech Language Pathology Treatment: Dysphagia  Patient Details Name: Tony Whitaker MRN: [id number] DOB: [birth year] Today's Date: 01/01/2014 Time: 0852-0900 SLP Time Calculation (min): 8 min  Assessment / Plan / Recommendation Clinical Impression  Brief observation as transport arrived for scheduled EEG.  Modified independence during self feeding/set up  Assist with breakfast.  Oral and pharyngeal swallow function WFL's.  Continue regular texture and thin liquids, with reflux precautions.  No further ST needed.   HPI HPI: Tony Whitaker is a 53 y.o. male history of HIV, was brought from the [facility] ER because patient was having altered mental status and nausea/vomiting.  In the ER, CT head and abdomen and pelvis unremarkable. Patient's drug screen is positive for cocaine benzo and marijuana. CXR is concerning for possible pneumonitis, with RLL infiltrate noted on CT abdomen. MRI pending.   Pertinent Vitals Pain Assessment: 0-10 Pain Score: 3  Pain Location: abdomen Pain Descriptors / Indicators: Cramping Pain Intervention(s): Repositioned  SLP Plan  All goals met;Discharge SLP treatment due to (comment)    Recommendations Diet recommendations: Regular;Thin liquid Liquids provided via: Cup;Straw Medication Administration: Whole meds with liquid Supervision: Patient able to self feed Compensations: Slow rate;Small sips/bites;Follow solids with liquid Postural Changes and/or Swallow Maneuvers: Seated upright 90 degrees;Upright 30-60 min after meal              Oral Care Recommendations: Oral care BID Follow up Recommendations: None Plan: All goals met;Discharge SLP treatment due to (comment)    GO     [place] [facility] 01/01/2014, 9:02 AM Orbie Pyo Colvin Caroli.Ed [employer] [phone]

## 2014-01-02 ENCOUNTER — Inpatient Hospital Stay (HOSPITAL_COMMUNITY): Payer: PRIVATE HEALTH INSURANCE

## 2014-01-02 DIAGNOSIS — I6320 Cerebral infarction due to unspecified occlusion or stenosis of unspecified precerebral arteries: Secondary | ICD-10-CM

## 2014-01-02 LAB — LEGIONELLA ANTIGEN, URINE: Legionella Antigen, Urine: NEGATIVE

## 2014-01-02 MED ORDER — AZITHROMYCIN 500 MG PO TABS: 500.0000 mg | ORAL_TABLET | Freq: Every day | ORAL | Status: DC

## 2014-01-02 MED ORDER — GADOBENATE DIMEGLUMINE 529 MG/ML IV SOLN
15.0000 mL | Freq: Once | INTRAVENOUS | Status: AC | PRN
  Administered 2014-01-02: 15 mL via INTRAVENOUS

## 2014-01-02 MED ORDER — ASPIRIN EC 325 MG PO TBEC
325.0000 mg | DELAYED_RELEASE_TABLET | Freq: Every day | ORAL | Status: DC
  Administered 2014-01-02 – 2014-01-03 (×2): 325 mg via ORAL
  Filled 2014-01-02 (×2): qty 1

## 2014-01-02 NOTE — Progress Notes (Signed)
STROKE TEAM PROGRESS NOTE   HISTORY Tony Whitaker is an 53 y.o. male with a history of hypertension, myocardial infarction, migraine headaches, HIV infection and chronic low back pain, admitted today for management of altered mental status with associated nausea and vomiting. CT scan of his head was unremarkable. Urine drug screen was positive for cocaine, benzodiazepines and marijuana. MRI showed an 8 mm weakly diffusion restricted lesion involving the left centrum semiovale. Lesion was somewhat age indeterminate. Subacute infarction cannot be ruled out. MRA showed no large vessel occlusion/severe stenosis.  NIH stroke score was 1 with persistent confusion. LSN: Unknown. Rankin: 1.  Patient was not administered TPA secondary to no focal deficits and unknown when last seen well. He was admitted for further evaluation and treatment.   SUBJECTIVE (INTERVAL HISTORY) His  Corrections officer is at the bedside.  Overall he feels his condition is unchanged.  He denies focal extremity weakness, numbness, dysarthria. He states his balance is off and he cannot stand straight and tends to fall backwards. He continues to have vomiting and diarrhea.    OBJECTIVE Temp:  [98 F (36.7 C)-99 F (37.2 C)] 98.6 F (37 C) (09/09 1344) Pulse Rate:  [60-89] 60 (09/09 1200) Cardiac Rhythm:  [-] Normal sinus rhythm (09/09 0500) Resp:  [16] 16 (09/09 1200) BP: (127-146)/(83-88) 139/83 mmHg (09/09 1200) SpO2:  [94 %-97 %] 97 % (09/09 1200)   Recent Labs Lab 12/31/13 0114  GLUCAP 102*    Recent Labs Lab 12/31/13 0030 12/31/13 0636 01/01/14 0754  NA 135* 132* 136*  K 4.0 3.9 3.9  CL 95* 97 102  CO2 GLUCOSE 118* 116* 96  BUN CREATININE 1.60* 1.41* 1.43*  CALCIUM 10.5 8.9 8.6    Recent Labs Lab 12/31/13 0030 12/31/13 0636  AST 25 18  ALT 19 16  ALKPHOS 59 53  BILITOT 0.7 0.4  PROT 10.1* 8.4*  ALBUMIN 4.0 3.5    Recent Labs Lab 12/31/13 0030 12/31/13 0636  01/01/14 0754  WBC 12.4* 10.3 6.8  NEUTROABS 8.6* 6.7  --   HGB 17.2* 15.3 14.4  HCT 48.0 42.2 39.7  MCV 85.4 86.3 84.8  PLT 257 216 204   No results found for this basename: CKTOTAL, CKMB, CKMBINDEX, TROPONINI,  in the last 168 hours No results found for this basename: LABPROT, INR,  in the last 72 hours  Recent Labs  12/31/13 0155  COLORURINE YELLOW  LABSPEC 1.014  PHURINE 6.0  GLUCOSEU NEGATIVE  HGBUR MODERATE*  BILIRUBINUR NEGATIVE  KETONESUR 15*  PROTEINUR 100*  UROBILINOGEN 0.2  NITRITE NEGATIVE  LEUKOCYTESUR NEGATIVE       Component Value Date/Time   CHOL 221* 01/01/2014 1457   Lab Results  Component Value Date   HGBA1C 5.7* 01/01/2014      Component Value Date/Time   LABOPIA NONE DETECTED 12/31/2013 0155   COCAINSCRNUR POSITIVE* 12/31/2013 0155   LABBENZ POSITIVE* 12/31/2013 0155   AMPHETMU NONE DETECTED 12/31/2013 0155   THCU POSITIVE* 12/31/2013 0155   LABBARB NONE DETECTED 12/31/2013 0155    No results found for this basename: ETH,  in the last 168 hours  Dg Chest 2 View 12/31/2013   Diffuse interstitial process in the lungs suggesting interstitial pneumonitis.     Ct Head Wo Contrast 12/31/2013   No acute intracranial abnormalities.     MRI & MRA Brain Wo Contrast 12/31/2013    1. No acute or subacute large territory infarct. 2. 8 mm peripherally diffusion  restricting lesion in the left centrum semiovale, indeterminate. Considerations include subacute infarct, opportunistic infection, demyelination, and metastasis. Postcontrast imaging may be helpful for further evaluation. Of note, there is no significant edema surrounding this lesion. 3. Several additional foci of white matter T2 signal abnormality, nonspecific. Considerations include mild chronic small vessel ischemic disease, sequelae of infection, demyelination, vasculitis, and trauma. 4. Motion degraded MRA.  No major intracranial arterial occlusion. 5. Apparent moderate to severe narrowing of the proximal to mid  basilar artery is felt to be at least largely in part artifactual due to motion. No significant proximal stenosis is seen in the anterior circulation. Mild-to-moderate anterior and posterior circulation branch vessel irregularity. 6. 1.5 mm infundibulum versus tiny aneurysm of the distal left ICA.     Ct Abdomen Pelvis W Contrast 12/31/2013   No focal acute process demonstrated in the abdomen or pelvis. Atelectasis in the lung bases with suggestion of small focal infiltration in the right lung base.   Electronically Signed   By: Burman Nieves M.D.   On: 12/31/2013 03:29    PHYSICAL EXAM Frail middle aged african [place] male not in distress.Awake alert. Afebrile. Head is nontraumatic. Neck is supple without bruit. Hearing is normal. Cardiac exam no murmur or gallop. Lungs are clear to auscultation. Distal pulses are well felt. Neurological Exam : Awake alert oriented to person and place only. Speech appears fluent without dysarthria. Follows simple one and two-step commands. Extraocular movements are full range without nystagmus. Blinks to threat bilaterally. Vision acuity seems adequate. Fundi were not visualized. No facial weakness. Tongue is midline. Able to move all 4 extremities well against gravity. No focal weakness. Poor truncal balance at times tends to rock backwards at times he can stand with good balance but tends to lean forward. Gait wide-based but effort seems variable.and has nonorganic component  ASSESSMENT/PLAN ;  [name] is a 53 y.o. male with history of hypertension, myocardial infarction, migraine headaches, HIV infection and chronic low back pain, admitted today for management of altered mental status with associated nausea and vomiting but no focal neurological deficits. Neurological exam is significant on leave for labile truncal ataxia but I doubt his cooperation. CT scan of his head was unremarkable. Urine drug screen was positive for cocaine, benzodiazepines and  marijuana. MRI showed an 8 mm weakly diffusion restricted lesion without ADC correlate involving the left centrum semiovale. Lesion ist age indeterminate with a wide differential diagnosis not necessarily stroke patient's present clinical symptoms are not compatible with this lesion. He did not receive IV t-PA due to No focal deficits and unknown when last seen well. Imaging negative for acute infarct.    no antithrombotics prior to admission, now on aspirin 300 mg suppository  MRI  No acute infarct. Recommend repeat limited MRI scan with thin coronals sections through the brain stem to look for   tiny infarct not seen on the regular MRI  MRA  No large vessel stenosis  Carotid Doppler  pending   2D Echo  pending    LDL pending   HgbA1c pending   Lovenox 40 mg sq daily or VTE prophylaxis  General thin liquids.   Up with assistance  Therapy needs:  Unable to receive as incarerated  Ongoing aggressive risk factor management  Risk factor education  Disposition:  Return to jail  Other Stroke Risk Factors HIV hypertension  Cigarette smoker, advised to stop smoking ETOH use   MI    Migraines  Other Pertinent History  Currently  incarcerated  Hospital day # 2     Recommend repeat limited MRI scan with thin coronals sections through the brain stem to look for   tiny infarct not seen on the regular MRI  Stroke Team will sign off. Call for questions. Incase  Repeat mRI is positive call us back   Delia Heady, MD Medical Director [facility] Pager: [phone] 01/02/2014 1:56 PM     To contact Stroke Continuity provider, please refer to [email or web address]. After hours, contact General Neurology

## 2014-01-02 NOTE — Progress Notes (Signed)
Physical Therapy Treatment Patient Details Name: Tony Whitaker MRN: [id number] DOB: 11/17/1960 Today's Date: 01/02/2014    History of Present Illness Tony Whitaker is a 53 y.o. male history of HIV, [facility] because patient was having altered mental status and nausea vomiting. As per the ER physician from whom I obtained most of the history patient has been recently arrested and was in jail on September 3. Following which patient became more confused and nausea vomiting and has not been eating well. Patient was brought to the ER. In the ER CT head and abdomen and pelvis unremarkable. Labs revealed acute renal failure. Drug screen positive for cocaine, marijuana and benzo.MRI showed an 8 mm diffusion restricted lesion involving the left centrum semiovale. Lesion was somewhat age indeterminate. Subacute infarction cannot be ruled out     PT Comments    Patient progressing well with mobility. S/p fall off BCS prior to PT arrival. Pt fatigues easily during treatment. Increased difficulty noted with balance/gait training today. Weakness noted in BLEs, which seems worsened from prior session. 1 LOB upon standing from [facility] requiring assist from therapist to prevent fall. Multiple cues required for safety and technique/sequencing during gait training. Will continue to follow and progress as tolerated.   Follow Up Recommendations  Other (comment) (pt unable to receive therapy in prison.)     Equipment Recommendations  Rolling walker with 5" wheels    Recommendations for Other Services       Precautions / Restrictions Precautions Precautions: Fall Precaution Comments: handcuffs and shackles Restrictions Weight Bearing Restrictions: No    Mobility  Bed Mobility               General bed mobility comments: Sitting EOB upon PT arrival.  Transfers Overall transfer level: Needs assistance Equipment used: Rolling walker (2 wheeled) Transfers: Sit  to/from [employer] Sit to Stand: Min assist;Min guard Stand pivot transfers: Min guard       General transfer comment: VC for hand placement. Upon standing from [facility], pt with posterior LOB requiring Min A to prevent fall. Despite cues, pt pushed RW aside when facing chair and performed a 180 degree turn to sit in chair, uncontrolled descent and plop. Increased trembling Bil knees upon standing.  Ambulation/Gait Ambulation/Gait assistance: Min assist Ambulation Distance (Feet): 24 Feet Assistive device: Rolling walker (2 wheeled) Gait Pattern/deviations: Step-to pattern;Step-through pattern;Trunk flexed;Narrow base of support Gait velocity: decreased Gait velocity interpretation: Below normal speed for age/gender General Gait Details: Increased use of UEs for stability/balance. Difficulty advancing BLEs even with assist for weighshifting. Toe touch initial contact BLEs and knee extension thrust bilaterally. Cues to shorten step lengths and and widen BoS. Fatigues easily.   Stairs            Wheelchair Mobility    Modified Rankin (Stroke Patients Only) Modified Rankin (Stroke Patients Only) Pre-Morbid Rankin Score: No symptoms Modified Rankin: Moderate disability     Balance Overall balance assessment: Needs assistance   Sitting balance-Leahy Scale: Good     Standing balance support: During functional activity Standing balance-Leahy Scale: Poor Standing balance comment: Requires BUE support during static/dynamic standing due to poor balance deficits/safety awareness.                    Cognition Arousal/Alertness: Awake/alert Behavior During Therapy: WFL for tasks assessed/performed;Impulsive Overall Cognitive Status: Impaired/Different from baseline Area of Impairment: Safety/judgement;Orientation Orientation Level: Disoriented to;Time       Safety/Judgement: Decreased awareness  of safety     General Comments: Pt impulsive today. Prior to  PT arrival, pt fell off BSC trying to get back to bed.     Exercises      General Comments General comments (skin integrity, edema, etc.): Pt declined any further exercise/activity due to being fatigued.      Pertinent Vitals/Pain Pain Assessment: No/denies pain (but reports feeling nauseous.)    Home Living                      Prior Function            PT Goals (current goals can now be found in the care plan section) Progress towards PT goals: Progressing toward goals    Frequency  Min 3X/week    PT Plan Current plan remains appropriate    Co-evaluation             End of Session Equipment Utilized During Treatment: Gait belt Activity Tolerance: Patient limited by fatigue Patient left: in chair;with call bell/phone within reach;with restraints reapplied (with 2 guards present at door.)     Time: 9629-5284 PT Time Calculation (min): 29 min  Charges:  $Gait Training: 23-37 mins                    G CodesAlvie Heidelberg A Jan 11, 2014, 1:30 PM Alvie Heidelberg, PT, DPT [phone]

## 2014-01-02 NOTE — Progress Notes (Addendum)
[facility] PROGRESS NOTE  Tony Whitaker ONG:[id number] DOB: 1961/01/15 DOA: 12/31/2013 PCP: No PCP Per Patient  Assessment/Plan: Acute encephalopathy -secondary to polysubstance use/drug withdrawal -resolved. Now awake and Alert. -EEG shows no abnormalities.    ?CVA -MRI shows 8 mm left centrum semiovale CVA, possible subacute infarct-However MRI findings do not explain initial/current manifestations. Not sure if functional component involved.Non focal exam -Not sure if acute CVA-work up-including repeat MRI in progress -Echo/Carotid Dopplers pending. A1C-5.7, LDL-148  Unsteady Gait - Per patient-he has chronic left lower extremity pain (from left waist area down to left knee)/weakness-going on for the past 2 years-and normally walks "with a limp". - Continue PT evaluation, await repeat MRI brain.  Nausea/ vomiting  - Resolved with supportive care. Abdominal exam remains benign. - CT abdomen and is unremarkable. Supportive care with Zofran    Possible pneumonia / aspiration pneumonitis - could be aspiration related.  - Patient has been empirically placed on ceftriaxone and Zithromax. - 9/9 afebrile, no leukocytosis, lungs are clear.  Will d/c antibiotics.  History of HIV  - patient sees Dr. Reginold Agent at [facility].   - Patient's HIV viral load was undetectable in 06/2013 and his CD4 count was 340.    - resumed home medications for HIV   Renal failure  - likely acute-no baseline labs available.  - Creatinine slowly down trending.  Expected faster improvement. - May need outpatient renal evaluation for HIV nephrosclerosis   Polysubstance abuse - social work consult.  Code Status: full Family Communication: Attempted to call [facility] at patient's request but received a recording that they were not accepting calls. Disposition Plan:  anticipate discharge ASAP back into custody when workup is completed    Brief narrative: Tony Whitaker is a 53 y.o. male history of  HIV, was [facility] because patient was having altered mental status and nausea vomiting. As per the ER physician from whom I obtained most of the history patient has been recently arrested and was in jail on September 3. Following which patient became more confused and nausea vomiting and has not been eating well. Patient was brought to the ER. In the ER CT head and abdomen and pelvis unremarkable. Labs revealed acute renal failure. Patient's drug screen is positive for cocaine benzo and marijuana.   Consultants: Neurology  Procedures: None  Antibiotics:  None  HPI/Subjective: No complaints.  Tells me he has difficulty standing due to weakness in his legs.  Objective: Filed Vitals:   01/01/14 1200 01/01/14 1800 01/02/14 0008 01/02/14 0545  BP: 156/83 146/84 141/86 127/88  Pulse: 62 60 88 89  Temp: 97.6 F (36.4 C) 98 F (36.7 C) 99 F (37.2 C) 98.8 F (37.1 C)  TempSrc: Oral Oral Oral Oral  Resp: Height:      Weight:      SpO2: 98% 97% 94% 96%    Intake/Output Summary (Last 24 hours) at 01/02/14 1301 Last data filed at 01/02/14 0349  Gross per 24 hour  Intake      0 ml  Output   1250 ml  Net  -1250 ml    Exam:  General: alert & oriented x 3 In NAD.  Handcuffed to bed. Cardiovascular: RRR, nl S1 s2  Respiratory: Decreased breath sounds at the bases, scattered rhonchi, no crackles  Abdomen: thin, soft +BS NT/ND, no masses palpable  Extremities: No cyanosis and no edema.  Lower extremities with 4/5 strength bilaterally (difficulty pushing against  resistance)      Data Reviewed: Basic Metabolic Panel:  Recent Labs Lab 12/31/13 0030 12/31/13 0636 01/01/14 0754  NA 135* 132* 136*  K 4.0 3.9 3.9  CL 95* 97 102  CO2 24 23 24   GLUCOSE 118* 116* 96  BUN 12 10 9   CREATININE 1.60* 1.41* 1.43*  CALCIUM 10.5 8.9 8.6    Liver Function Tests:  Recent Labs Lab 12/31/13 0030 12/31/13 0636  AST 25 18  ALT 19 16   ALKPHOS 59 53  BILITOT 0.7 0.4  PROT 10.1* 8.4*  ALBUMIN 4.0 3.5    Recent Labs Lab 12/31/13 0030  LIPASE 22    Recent Labs Lab 12/31/13 0710  AMMONIA 29    CBC:  Recent Labs Lab 12/31/13 0030 12/31/13 0636 01/01/14 0754  WBC 12.4* 10.3 6.8  NEUTROABS 8.6* 6.7  --   HGB 17.2* 15.3 14.4  HCT 48.0 42.2 39.7  MCV 85.4 86.3 84.8  PLT 257 216 204    CBG:  Recent Labs Lab 12/31/13 0114  GLUCAP 102*    Recent Results (from the past 240 hour(s))  MRSA PCR SCREENING     Status: Abnormal   Collection Time    12/31/13  6:36 AM      Result Value Ref Range Status   MRSA by PCR POSITIVE (*) NEGATIVE Final   Comment:            The GeneXpert MRSA Assay (FDA     approved for NASAL specimens     only), is one component of a     comprehensive MRSA colonization     surveillance program. It is not     intended to diagnose MRSA     infection nor to guide or     monitor treatment for     MRSA infections.     RESULT CALLED TO, READ BACK BY AND VERIFIED WITH:     [facility] RN 10:25 12/31/13 ([email or web address])     Studies: Dg Chest 2 View  12/31/2013   CLINICAL DATA:  Cough.  HIV.  EXAM: CHEST  2 VIEW  COMPARISON:  03/17/2013  FINDINGS: Normal heart size and pulmonary vascularity. Interstitial changes in both lungs, possibly representing interstitial pneumonitis or fibrosis. No focal consolidation or airspace disease. No blunting of costophrenic angles. No pneumothorax. Tortuous aorta.  IMPRESSION: Diffuse interstitial process in the lungs suggesting interstitial pneumonitis.   Electronically Signed   By: Burman Nieves M.D.   On: 12/31/2013 01:51   Ct Head Wo Contrast  12/31/2013   CLINICAL DATA:  Altered mental status normal and slurred speech. Multiple falls yesterday. Vomiting and constipation.  EXAM: CT HEAD WITHOUT CONTRAST  TECHNIQUE: Contiguous axial images were obtained from the base of the skull through the vertex without intravenous contrast.  COMPARISON:  None.  FINDINGS:  Technically limited study due to motion artifact. Ventricles and sulci appear symmetrical. No mass effect or midline shift. No abnormal extra-axial fluid collections. Gray-white matter junctions are distinct. Basal cisterns are not effaced. No evidence of acute intracranial hemorrhage. No depressed skull fractures. Visualized paranasal sinuses and mastoid air cells are not opacified. Vascular calcifications.  IMPRESSION: No acute intracranial abnormalities.   Electronically Signed   By: Burman Nieves M.D.   On: 12/31/2013 02:02   Mr Maxine Glenn Head Wo Contrast  12/31/2013   CLINICAL DATA:  Altered mental status.  HIV.  EXAM: MRI HEAD WITHOUT CONTRAST  MRA HEAD WITHOUT CONTRAST  TECHNIQUE: Multiplanar, multiecho pulse sequences of the brain  and surrounding structures were obtained without intravenous contrast. Angiographic images of the head were obtained using MRA technique without contrast.  COMPARISON:  Head CT 12/31/2013  FINDINGS: MRI HEAD FINDINGS  Images are mildly to moderately degraded by motion artifact. Gradient echo/susceptibility weighted images were not obtained due to patient's inability to cooperate with the examination.  There is an 8 mm T2 hyperintense focus in the left centrum semiovale which demonstrates a peripheral ring of restricted diffusion with evidence of facilitated diffusion centrally. No other restricted diffusion is seen elsewhere to indicate acute infarct. No gross intracranial hemorrhage is seen. There is no midline shift or extra-axial fluid collection. Several small, scattered foci of T2 hyperintensity are present elsewhere in the subcortical and deep cerebral white matter. Ventricles and sulci are within normal limits for age.  Orbits are unremarkable. Paranasal sinuses and mastoid air cells are clear. Major intracranial vascular flow voids are preserved.  MRA HEAD FINDINGS  Images are mildly to moderately degraded by motion artifact.  Visualized distal vertebral arteries are patent  and codominant. PICA origins are patent. Basilar artery is patent. There is apparent moderate to severe irregular narrowing of the proximal and mid basilar artery, although evaluation is limited by motion through this area and the basilar is relatively normal in appearance on whole brain T2 weighted images. Distal basilar artery is unremarkable. SCA origins are patent. There are fetal type origins of the PCAs bilaterally, with the left P1 segment being hypoplastic and the right P1 segment likely absent. No high-grade proximal PCA stenosis is seen. There is mild-to-moderate PCA branch vessel irregularity.  Internal carotid arteries are patent from skullbase to carotid termini without evidence significant stenosis. There is a 1.5 mm posteriorly directed outpouching from the distal left supraclinoid ICA. Right A1 segment is either severely hypoplastic or absent. The right A2 is supplied by the left ACA via the anterior communicating artery. No proximal MCA stenosis is seen. There is mild-to-moderate bilateral MCA branch vessel irregular narrowing.  IMPRESSION: 1. No acute or subacute large territory infarct. 2. 8 mm peripherally diffusion restricting lesion in the left centrum semiovale, indeterminate. Considerations include subacute infarct, opportunistic infection, demyelination, and metastasis. Postcontrast imaging may be helpful for further evaluation. Of note, there is no significant edema surrounding this lesion. 3. Several additional foci of white matter T2 signal abnormality, nonspecific. Considerations include mild chronic small vessel ischemic disease, sequelae of infection, demyelination, vasculitis, and trauma. 4. Motion degraded MRA.  No major intracranial arterial occlusion. 5. Apparent moderate to severe narrowing of the proximal to mid basilar artery is felt to be at least largely in part artifactual due to motion. No significant proximal stenosis is seen in the anterior circulation. Mild-to-moderate  anterior and posterior circulation branch vessel irregularity. 6. 1.5 mm infundibulum versus tiny aneurysm of the distal left ICA.   Electronically Signed   By: Sebastian Ache   On: 12/31/2013 16:30   Mr Brain Wo Contrast  12/31/2013   CLINICAL DATA:  Altered mental status.  HIV.  EXAM: MRI HEAD WITHOUT CONTRAST  MRA HEAD WITHOUT CONTRAST  TECHNIQUE: Multiplanar, multiecho pulse sequences of the brain and surrounding structures were obtained without intravenous contrast. Angiographic images of the head were obtained using MRA technique without contrast.  COMPARISON:  Head CT 12/31/2013  FINDINGS: MRI HEAD FINDINGS  Images are mildly to moderately degraded by motion artifact. Gradient echo/susceptibility weighted images were not obtained due to patient's inability to cooperate with the examination.  There is an 8 mm T2  hyperintense focus in the left centrum semiovale which demonstrates a peripheral ring of restricted diffusion with evidence of facilitated diffusion centrally. No other restricted diffusion is seen elsewhere to indicate acute infarct. No gross intracranial hemorrhage is seen. There is no midline shift or extra-axial fluid collection. Several small, scattered foci of T2 hyperintensity are present elsewhere in the subcortical and deep cerebral white matter. Ventricles and sulci are within normal limits for age.  Orbits are unremarkable. Paranasal sinuses and mastoid air cells are clear. Major intracranial vascular flow voids are preserved.  MRA HEAD FINDINGS  Images are mildly to moderately degraded by motion artifact.  Visualized distal vertebral arteries are patent and codominant. PICA origins are patent. Basilar artery is patent. There is apparent moderate to severe irregular narrowing of the proximal and mid basilar artery, although evaluation is limited by motion through this area and the basilar is relatively normal in appearance on whole brain T2 weighted images. Distal basilar artery is  unremarkable. SCA origins are patent. There are fetal type origins of the PCAs bilaterally, with the left P1 segment being hypoplastic and the right P1 segment likely absent. No high-grade proximal PCA stenosis is seen. There is mild-to-moderate PCA branch vessel irregularity.  Internal carotid arteries are patent from skullbase to carotid termini without evidence significant stenosis. There is a 1.5 mm posteriorly directed outpouching from the distal left supraclinoid ICA. Right A1 segment is either severely hypoplastic or absent. The right A2 is supplied by the left ACA via the anterior communicating artery. No proximal MCA stenosis is seen. There is mild-to-moderate bilateral MCA branch vessel irregular narrowing.  IMPRESSION: 1. No acute or subacute large territory infarct. 2. 8 mm peripherally diffusion restricting lesion in the left centrum semiovale, indeterminate. Considerations include subacute infarct, opportunistic infection, demyelination, and metastasis. Postcontrast imaging may be helpful for further evaluation. Of note, there is no significant edema surrounding this lesion. 3. Several additional foci of white matter T2 signal abnormality, nonspecific. Considerations include mild chronic small vessel ischemic disease, sequelae of infection, demyelination, vasculitis, and trauma. 4. Motion degraded MRA.  No major intracranial arterial occlusion. 5. Apparent moderate to severe narrowing of the proximal to mid basilar artery is felt to be at least largely in part artifactual due to motion. No significant proximal stenosis is seen in the anterior circulation. Mild-to-moderate anterior and posterior circulation branch vessel irregularity. 6. 1.5 mm infundibulum versus tiny aneurysm of the distal left ICA.   Electronically Signed   By: Sebastian Ache   On: 12/31/2013 16:30   Ct Abdomen Pelvis W Contrast  12/31/2013   CLINICAL DATA:  Abdominal pain.  EXAM: CT ABDOMEN AND PELVIS WITH CONTRAST  TECHNIQUE:  Multidetector CT imaging of the abdomen and pelvis was performed using the standard protocol following bolus administration of intravenous contrast.  CONTRAST:  80mL OMNIPAQUE IOHEXOL 300 MG/ML SOLN, 50mL OMNIPAQUE IOHEXOL 300 MG/ML SOLN  COMPARISON:  02/16/2011  FINDINGS: Atelectasis in the lung bases. Suggestion of small focal area of infiltration in the right lung base. Coronary artery calcifications.  Diffuse fatty infiltration of the liver with focal more prominent fatty infiltration along the falciform ligament. The gallbladder, spleen, pancreas, adrenal glands, abdominal aorta, inferior vena cava, and retroperitoneal lymph nodes are unremarkable. Subcentimeter parenchymal cysts in the kidneys. No hydronephrosis or hydroureter. Small accessory spleen. Stomach and small bowel are normal for degree of distention. Scattered stool in the colon without distention or wall thickening. No free air or free fluid in the abdomen.  Pelvis: The  appendix is normal. No evidence of diverticulitis. Bladder wall is not thickened. Prostate gland is not enlarged. No free or loculated pelvic fluid collections. No pelvic mass or lymphadenopathy. Spondylolysis with minimal spondylolisthesis at L4-5. No destructive bone lesions.  IMPRESSION: No focal acute process demonstrated in the abdomen or pelvis. Atelectasis in the lung bases with suggestion of small focal infiltration in the right lung base.   Electronically Signed   By: Burman Nieves M.D.   On: 12/31/2013 03:29    Scheduled Meds: . abacavir  600 mg Oral Daily   And  . lamiVUDine  300 mg Oral Daily  . aspirin EC  325 mg Oral Daily  . Chlorhexidine Gluconate Cloth  6 each Topical Q0600  . darunavir-cobicistat  1 tablet Oral Q breakfast  . enoxaparin (LOVENOX) injection  40 mg Subcutaneous Q24H  . folic acid  1 mg Oral Daily  . multivitamin with minerals  1 tablet Oral Daily  . mupirocin ointment  1 application Nasal BID  . sodium chloride  3 mL Intravenous Q12H   . thiamine  100 mg Oral Daily   Continuous Infusions:   Principal Problem:   Acute encephalopathy Active Problems:   Nausea & vomiting   Renal failure   Abnormal finding on MRI of brain    Time spent: 40 minutes  Conley Canal  [facility] Pager [phone] If 7PM-7AM, please contact night-coverage at [email or web address], password [facility] 01/02/2014, 1:01 PM  LOS: 2 days   Attending Patient was seen, examined,treatment plan was discussed with the Physician extender. I have directly reviewed the clinical findings, lab, imaging studies and management of this patient in detail. I have made the necessary changes to the above noted documentation, and agree with the documentation, as recorded by the Physician extender.  Windell Norfolk MD [facility].

## 2014-01-02 NOTE — Progress Notes (Signed)
01/02/14 1200  What Happened  Was fall witnessed? Yes  Who witnessed fall? police officer  Patients activity before fall bathroom-assisted (bedside commode - pt in shackles)  Point of contact hip/leg;arm/shoulder  Was patient injured? No  Follow Up  MD notified Ghimire  Time MD notified [id number]  Family notified No- patient refusal  Additional tests No  Simple treatment Other (comment) (no injury to treat)  Progress note created (see row info) Yes

## 2014-01-03 LAB — BASIC METABOLIC PANEL
ANION GAP: 11 (ref 5–15)
BUN: 8 mg/dL (ref 6–23)
CHLORIDE: 99 meq/L (ref 96–112)
CO2: 25 mEq/L (ref 19–32)
CREATININE: 1.48 mg/dL — AB (ref 0.50–1.35)
Calcium: 8.9 mg/dL (ref 8.4–10.5)
GFR calc Af Amer: 62 mL/min — ABNORMAL LOW (ref >90)
GFR, EST NON AFRICAN AMERICAN: 53 mL/min — AB (ref >90)
Glucose, Bld: 156 mg/dL — ABNORMAL HIGH (ref 70–99)
POTASSIUM: 3.8 meq/L (ref 3.7–5.3)
Sodium: 135 mEq/L — ABNORMAL LOW (ref 137–147)

## 2014-01-03 MED ORDER — ASPIRIN 325 MG PO TBEC: 325.0000 mg | DELAYED_RELEASE_TABLET | Freq: Every day | ORAL | Status: DC

## 2014-01-03 MED ORDER — ADULT MULTIVITAMIN W/MINERALS CH: 1.0000 | ORAL_TABLET | Freq: Every day | ORAL | Status: AC

## 2014-01-03 MED ORDER — ACETAMINOPHEN 325 MG PO TABS: 650.0000 mg | ORAL_TABLET | Freq: Four times a day (QID) | ORAL | Status: AC | PRN

## 2014-01-03 MED ORDER — PRAVASTATIN SODIUM 20 MG PO TABS
20.0000 mg | ORAL_TABLET | Freq: Every day | ORAL | Status: DC
  Filled 2014-01-03: qty 1

## 2014-01-03 MED ORDER — ASPIRIN 325 MG PO TBEC: 325.0000 mg | DELAYED_RELEASE_TABLET | Freq: Every day | ORAL | Status: AC

## 2014-01-03 MED ORDER — PRAVASTATIN SODIUM 20 MG PO TABS: 20.0000 mg | ORAL_TABLET | Freq: Every day | ORAL | Status: DC

## 2014-01-03 NOTE — Progress Notes (Signed)
Physical Therapy Treatment Patient Details Name: Tony Whitaker MRN: [id number] DOB: 11/07/1960 Today's Date: 01/03/2014    History of Present Illness Tony Whitaker is a 53 y.o. male history of HIV, was [facility] because patient was having altered mental status and nausea vomiting. As per the ER physician from whom I obtained most of the history patient has been recently arrested and was in jail on September 3. Following which patient became more confused and nausea vomiting and has not been eating well. Patient was brought to the ER. In the ER CT head and abdomen and pelvis unremarkable. Labs revealed acute renal failure. Drug screen positive for cocaine, marijuana and benzo.MRI showed an 8 mm diffusion restricted lesion involving the left centrum semiovale. Lesion was somewhat age indeterminate. Subacute infarction cannot be ruled out     PT Comments    Pt was seen for instruction of his use of a walker but finally decided not to orderone.   Pt has a great deal of complaint about an old back pain which according to him as 3 year duration.  He is not appropriate in a detention center for the use of a SPC due to the risk of pt being able to use as a weapon.  Recommended to MD to let MD at jail decide what if any AD is needed due to his LT nature of weakness and non use of AD for the same in  The past.  Follow Up Recommendations  Other (comment) (MD to screen at jail)     Equipment Recommendations  None recommended by PT    Recommendations for Other Services Other (comment) (MD at jail can screen to see if pt requries further therapy)     Precautions / Restrictions Precautions Precautions: Fall Precaution Comments: handcuffs and shackles    Mobility  Bed Mobility Overal bed mobility: Modified Independent             General bed mobility comments: able to get into bed without assistance  Transfers Overall transfer level: Modified  independent Equipment used: Rolling walker (2 wheeled) Transfers: Sit to/from [employer] Sit to Stand: Supervision Stand pivot transfers: Supervision       General transfer comment: vc's for safety but was able to leave walker behind and just cued for safety  Ambulation/Gait Ambulation/Gait assistance: Supervision;Min guard Ambulation Distance (Feet): 200 Feet Assistive device: Rolling walker (2 wheeled) Gait Pattern/deviations: Step-through pattern;Wide base of support Gait velocity: normal Gait velocity interpretation: at or above normal speed for age/gender General Gait Details: Cautioned pt to use walker rolling as he was lifting to move each step.  Very capable with no limp on walker but without slower and minor LLE llimp   Stairs            Wheelchair Mobility    Modified Rankin (Stroke Patients Only) Modified Rankin (Stroke Patients Only) Pre-Morbid Rankin Score: No symptoms Modified Rankin: Slight disability     Balance Overall balance assessment: Needs assistance Sitting-balance support: Feet supported Sitting balance-Leahy Scale: Good   Postural control: Right lateral lean Standing balance support: Bilateral upper extremity supported Standing balance-Leahy Scale: Fair Standing balance comment: Can stand without assistive device and walker used for speedand management oflimp only with gait                    Cognition Arousal/Alertness: Awake/alert Behavior During Therapy: WFL for tasks assessed/performed;Impulsive Overall Cognitive Status: Within Functional Limits for tasks assessed Area of  Impairment: Safety/judgement         Safety/Judgement: Decreased awareness of safety     General Comments: Impulsive presentation of his behavior with pt tending to push walker withforce and making  aggressive comments to police officers    Exercises      General Comments General comments (skin integrity, edema, etc.): Discharge is  being planned back to jail and anticipate he will  not take a cane as this presents a safety risk in that environment      Pertinent Vitals/Pain Pain Assessment: 0-10 Pain Score: 5  Pain Location: L hip and posterior to L knee Pain Intervention(s): Monitored during session    Home Living Family/patient expects to be discharged to:: Dentention/Prison                    Prior Function Level of Independence: Independent      Comments: Pt was functioning on his own does report at least 2 recent falls   PT Goals (current goals can now be found in the care plan section) Acute Rehab PT Goals Patient Stated Goal: not stated    Frequency  Min 3X/week    PT Plan      Co-evaluation             End of Session Equipment Utilized During Treatment: Gait belt Activity Tolerance: Patient tolerated treatment well Patient left: in bed;with call bell/phone within reach;with restraints reapplied;with bed alarm set     Time: 4098-1191 PT Time Calculation (min): 29 min  Charges:  $Gait Training: 8-22 mins                    G Codes:      Tony Whitaker 2014/01/22, 3:42 PM  Samul Dada, PT MS Acute Rehab Dept. Number: [phone]

## 2014-01-03 NOTE — Progress Notes (Signed)
STROKE TEAM PROGRESS NOTE   HISTORY Tony Whitaker is an 53 y.o. male with a history of hypertension, myocardial infarction, migraine headaches, HIV infection and chronic low back pain, admitted today for management of altered mental status with associated nausea and vomiting. CT scan of his head was unremarkable. Urine drug screen was positive for cocaine, benzodiazepines and marijuana. MRI showed an 8 mm weakly diffusion restricted lesion involving the left centrum semiovale. Lesion was somewhat age indeterminate. Subacute infarction cannot be ruled out. MRA showed no large vessel occlusion/severe stenosis.  NIH stroke score was 1 with persistent confusion. LSN: Unknown. Rankin: 1.  Patient was not administered TPA secondary to no focal deficits and unknown when last seen well. He was admitted for further evaluation and treatment.   SUBJECTIVE (INTERVAL HISTORY) His  Corrections officer is at the bedside.  Overall he feels his condition is improving  He denies focal extremity weakness, numbness, dysarthria. . repeat MRI scan of the brain with thin sections shows a new tiny diffusion abnormality in the left frontal region not seen on the previous scan but it is too tiny and could have easily been missed. This cannot explain the patient's symptoms in my opinion Ultrasound done today shows no significant extracranial stenosis OBJECTIVE Temp:  [97.7 F (36.5 C)-98.7 F (37.1 C)] 98.7 F (37.1 C) (09/10 1326) Pulse Rate:  [70-88] 75 (09/10 1326) Cardiac Rhythm:  [-]  Resp:  [16-18] 16 (09/10 1326) BP: (108-133)/(60-79) 133/60 mmHg (09/10 1326) SpO2:  [96 %-100 %] 98 % (09/10 1326)   Recent Labs Lab 12/31/13 0114  GLUCAP 102*    Recent Labs Lab 12/31/13 0030 12/31/13 0636 01/01/14 0754 01/03/14 1010  NA 135* 132* 136* 135*  K 4.0 3.9 3.9 3.8  CL 95* 97 102 99  CO2 GLUCOSE 118* 116* 96 156*  BUN CREATININE 1.60* 1.41* 1.43* 1.48*  CALCIUM 10.5 8.9 8.6 8.9     Recent Labs Lab 12/31/13 0030 12/31/13 0636  AST 25 18  ALT 19 16  ALKPHOS 59 53  BILITOT 0.7 0.4  PROT 10.1* 8.4*  ALBUMIN 4.0 3.5    Recent Labs Lab 12/31/13 0030 12/31/13 0636 01/01/14 0754  WBC 12.4* 10.3 6.8  NEUTROABS 8.6* 6.7  --   HGB 17.2* 15.3 14.4  HCT 48.0 42.2 39.7  MCV 85.4 86.3 84.8  PLT 257 216 204   No results found for this basename: CKTOTAL, CKMB, CKMBINDEX, TROPONINI,  in the last 168 hours No results found for this basename: LABPROT, INR,  in the last 72 hours No results found for this basename: COLORURINE, APPERANCEUR, LABSPEC, PHURINE, GLUCOSEU, HGBUR, BILIRUBINUR, KETONESUR, PROTEINUR, UROBILINOGEN, NITRITE, LEUKOCYTESUR,  in the last 72 hours     Component Value Date/Time   CHOL 221* 01/01/2014 1457   Lab Results  Component Value Date   HGBA1C 5.7* 01/01/2014      Component Value Date/Time   LABOPIA NONE DETECTED 12/31/2013 0155   COCAINSCRNUR POSITIVE* 12/31/2013 0155   LABBENZ POSITIVE* 12/31/2013 0155   AMPHETMU NONE DETECTED 12/31/2013 0155   THCU POSITIVE* 12/31/2013 0155   LABBARB NONE DETECTED 12/31/2013 0155    No results found for this basename: ETH,  in the last 168 hours  Dg Chest 2 View 12/31/2013   Diffuse interstitial process in the lungs suggesting interstitial pneumonitis.     Ct Head Wo Contrast 12/31/2013   No acute intracranial abnormalities.     MRI & MRA Brain Wo  Contrast 12/31/2013    1. No acute or subacute large territory infarct. 2. 8 mm peripherally diffusion restricting lesion in the left centrum semiovale, indeterminate. Considerations include subacute infarct, opportunistic infection, demyelination, and metastasis. Postcontrast imaging may be helpful for further evaluation. Of note, there is no significant edema surrounding this lesion. 3. Several additional foci of white matter T2 signal abnormality, nonspecific. Considerations include mild chronic small vessel ischemic disease, sequelae of infection, demyelination,  vasculitis, and trauma. 4. Motion degraded MRA.  No major intracranial arterial occlusion. 5. Apparent moderate to severe narrowing of the proximal to mid basilar artery is felt to be at least largely in part artifactual due to motion. No significant proximal stenosis is seen in the anterior circulation. Mild-to-moderate anterior and posterior circulation branch vessel irregularity. 6. 1.5 mm infundibulum versus tiny aneurysm of the distal left ICA.     Ct Abdomen Pelvis W Contrast 12/31/2013   No focal acute process demonstrated in the abdomen or pelvis. Atelectasis in the lung bases with suggestion of small focal infiltration in the right lung base.   Electronically Signed   By: Burman Nieves M.D.   On: 12/31/2013 03:29    PHYSICAL EXAM Frail middle aged african [place] male not in distress.Awake alert. Afebrile. Head is nontraumatic. Neck is supple without bruit. Hearing is normal. Cardiac exam no murmur or gallop. Lungs are clear to auscultation. Distal pulses are well felt. Neurological Exam : Awake alert oriented to person and place only. Speech appears fluent without dysarthria. Follows simple one and two-step commands. Extraocular movements are full range without nystagmus. Blinks to threat bilaterally. Vision acuity seems adequate. Fundi were not visualized. No facial weakness. Tongue is midline. Able to move all 4 extremities well against gravity. No focal weakness. Poor truncal balance at times tends to rock backwards at times he can stand with good balance but tends to lean forward. Gait wide-based but effort seems variable.and has nonorganic component  ASSESSMENT/PLAN ;   [name] is a 53 y.o. male with history of hypertension, myocardial infarction, migraine headaches, HIV infection and chronic low back pain, admitted today for management of altered mental status with associated nausea and vomiting but no focal neurological deficits. Neurological exam is significant on leave  for labile truncal ataxia but I doubt his cooperation. CT scan of his head was unremarkable. Urine drug screen was positive for cocaine, benzodiazepines and marijuana. MRI showed an 8 mm weakly diffusion restricted lesion without ADC correlate involving the left centrum semiovale. Lesion is age indeterminate with a wide differential diagnosis not necessarily stroke patient's present clinical symptoms are not compatible with this lesion. 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 Onno antithrombotics prior to admission, now on aspirin 300 mg suppository  MRI  No acute infarct. Recommend repeat limited MRI scan with thin coronals sections through the brain stem to look for   tiny infarct not seen on the regular MRI  MRA  No large vessel stenosis  Carotid Doppler  pending   2D Echo  pending    LDL pending   HgbA1c pending   Lovenox 40 mg sq daily or VTE prophylaxis  General thin liquids.   Up with assistance  Therapy needs:  Unable to receive as incarerated  Ongoing aggressive risk factor management  Risk factor education  Disposition:  Return to jail  Other Stroke Risk Factors HIV hypertension  Cigarette smoker, advised to stop smoking ETOH use   MI    Migraines  Other Pertinent History  Currently incarcerated  Hospital day # 3   Recommend continue aspirin for stroke prevention follow echocardiogram results. mobilize out of bed as tolerated. May discharge back to prison if therapy feels he is safe to do so. No further stroke workup is necessary. Stroke Team will sign off. Call for questions.   Delia Heady, MD Medical Director [facility] Pager: [phone] 01/03/2014 1:55 PM     To contact Stroke Continuity provider, please refer to [email or web address]. After hours, contact General Neurology

## 2014-01-03 NOTE — Progress Notes (Addendum)
Officer given discharge paperwork and prescriptions.  PIV removed.  Spoke with Dawn from [facility] about patient's status.  Pt taken down with officers to discharge location.

## 2014-01-03 NOTE — Discharge Summary (Signed)
Physician Discharge Summary  Tony Whitaker ZOX:[id number] DOB: 09/23/1960 DOA: 12/31/2013  PCP: No PCP Per Patient  Admit date: 12/31/2013 Discharge date: 01/03/2014  Time spent: 45 minutes  Recommendations for Outpatient Follow-up:  1.   BMET in 1 week - follow creatinine  2.   LFTs in 1 month - started statin during this hospitalization. 3.   Needs to remain on Aspirin 325 mg daily for stroke prevention. 4.   Follow up with Dr. Reginold Agent at [facility] for HIV in 2-4 weeks.   Discharge Diagnoses:  Principal Problem:   Acute encephalopathy Active Problems:   Nausea & vomiting   Renal failure   Abnormal finding on MRI of brain   Discharge Condition: stable. Still with irregular gait.  Diet recommendation: Heart Healthy   Filed Weights   12/31/13 0008  Weight: 74.844 kg (165 lb)    History of present illness at the time of admission:  Tony Whitaker is a 53 y.o. male history of HIV, MI, and chronic low back pain, was brought from the [facility] ER due to altered mental status and nausea vomiting.  On exam patient was lethargic and not providing much history.  Per the ER physician he has been recently arrested and was placed in jail on September 3. Following which patient became more confused developed nausea vomiting and has not been eating well. In the ER labs revealed acute renal failure.  Patient's drug screen is positive for cocaine benzo and marijuana.  Hospital Course:   Acute encephalopathy  -secondary to polysubstance use/drug withdrawal  -resolved. Patient was awake and alert 24 hours after admission. -EEG shows no abnormalities.   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 Believe his symptoms were exacerbated by drug use. -2D echo shows  no possible source (emboli).  Carotid dopplers show minimal stenosis 1-39% bilaterally -A1C-5.7, LDL-148  -Stroke team recommends daily aspirin 325 mg.  Unsteady Gait  - Per patient-he has chronic left lower extremity pain (from left waist area down to left knee)/weakness-going on for the past 2 years-and normally walks "with a limp".  - Evaluated by PT, Significantly improved today-able to ambulate initially without a walker and then with a walker down the hall. Per patient-he is close to usual baseline-he has a chronic problem with his left leg-walks with a limp. Since chronic issue, stable for further work up/monitoring/treatment in the outpatient setting  Nausea/ vomiting  - Resolved with supportive care. Abdominal exam remained benign.  - CT abdomen and is unremarkable.   Possible pneumonia / aspiration pneumonitis  - could be aspiration related.  - Patient has been empirically placed on ceftriaxone and Zithromax.  - 9/9 afebrile, no leukocytosis, lungs are clear. D/C'd antibiotics on 9/9.   History of HIV  - patient sees Dr. Reginold Agent at [facility].  - Patient's HIV viral load was undetectable in 06/2013 and his CD4 count was 340.  - resumed home medications for HIV   Renal failure  - likely acute.  No labs available to determine baseline.  I suspect he is at his baseline now (approximately 1.45) - Recommend outpatient renal evaluation for HIV nephrosclerosis   Polysubstance abuse - patient counseled to abstain completely from drug use.  Procedures:  None  Consultations:  Neurology / Stroke team.  Discharge Exam: Filed Vitals:   01/03/14 1326  BP: 133/60  Pulse: 75  Temp: 98.7 F (37.1 C)  Resp: 16   General: alert & oriented x 3 In NAD. Handcuffed to bed. Reports feeling better today Cardiovascular: RRR, nl S1 s2  Respiratory: CTA no wheezes crackles or rals. Abdomen: thin, soft +BS NT/ND, no masses palpable   Extremities: No cyanosis and no edema.  Able to  stand.   Discharge Instructions    Current Discharge Medication List    START taking these medications   Details  acetaminophen (TYLENOL) 325 MG tablet Take 2 tablets (650 mg total) by mouth every 6 (six) hours as needed for mild pain (or Fever >/= 101).    aspirin EC 325 MG EC tablet Take 1 tablet (325 mg total) by mouth daily. Qty: 30 tablet, Refills: 0    Multiple Vitamin (MULTIVITAMIN WITH MINERALS) TABS tablet Take 1 tablet by mouth daily.    pravastatin (PRAVACHOL) 20 MG tablet Take 1 tablet (20 mg total) by mouth daily at 6 PM. Qty: 30 tablet, Refills: 2      CONTINUE these medications which have NOT CHANGED   Details  abacavir-lamiVUDine (EPZICOM) 600-300 MG per tablet Take 1 tablet by mouth daily.    amitriptyline (ELAVIL) 25 MG tablet Take 25 mg by mouth daily.    darunavir-cobicistat (PREZCOBIX) 800-150 MG per tablet Take 1 tablet by mouth daily. Swallow whole. Do NOT crush, break or chew tablets. Take with food.    HYDROCORTISONE EX Apply 1 application topically daily as needed (dry skin).    ibuprofen (ADVIL,MOTRIN) 200 MG tablet Take 200-400 mg by mouth 3 (three) times daily as needed (pain).    PRESCRIPTION MEDICATION Inhale 2 puffs into the lungs every 6 (six) hours as needed (shortness of breath/wheezing).       No Known Allergies    The results of significant diagnostics from this hospitalization (including imaging, microbiology, ancillary and laboratory) are listed below for reference.    Significant Diagnostic Studies: Dg Chest 2 View  12/31/2013   CLINICAL DATA:  Cough.  HIV.  EXAM: CHEST  2 VIEW  COMPARISON:  03/17/2013  FINDINGS: Normal heart size and pulmonary vascularity. Interstitial changes in both lungs, possibly representing interstitial pneumonitis or fibrosis. No focal consolidation or airspace disease. No blunting of costophrenic angles. No pneumothorax. Tortuous aorta.  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   Electronically Signed   By: Burman Nieves M.D.   On: 12/31/2013 01:51   Ct Head Wo Contrast  12/31/2013   CLINICAL DATA:  Altered mental status normal and slurred speech. Multiple falls yesterday. Vomiting and constipation.  EXAM: CT HEAD WITHOUT CONTRAST  TECHNIQUE: Contiguous axial images were obtained from the base of the skull through the vertex without intravenous contrast.  COMPARISON:  None.  FINDINGS: Technically limited study due to motion artifact. Ventricles and sulci appear symmetrical. No mass effect or midline shift. No abnormal extra-axial fluid collections. Gray-white matter junctions are distinct. Basal cisterns are not effaced. No evidence of acute intracranial hemorrhage. No depressed skull fractures. Visualized paranasal sinuses and mastoid air cells are not opacified. Vascular calcifications.  IMPRESSION: No acute intracranial abnormalities.   Electronically Signed   By: Burman Nieves M.D.   On: 12/31/2013 02:02   Mr Maxine Glenn Head Wo Contrast  12/31/2013   CLINICAL DATA:  Altered mental status.  HIV.  EXAM: MRI HEAD WITHOUT CONTRAST  MRA HEAD WITHOUT CONTRAST  TECHNIQUE: Multiplanar, multiecho pulse sequences of the brain and surrounding structures were  obtained without intravenous contrast. Angiographic images of the head were obtained using MRA technique without contrast.  COMPARISON:  Head CT 12/31/2013  FINDINGS: MRI HEAD FINDINGS  Images are mildly to moderately degraded by motion artifact. Gradient echo/susceptibility weighted images were not obtained due to patient's inability to cooperate with the examination.  There is an 8 mm T2 hyperintense focus in the left centrum semiovale which demonstrates a peripheral ring of restricted diffusion with evidence of facilitated diffusion centrally. No other restricted diffusion is seen elsewhere to indicate acute infarct. No gross intracranial hemorrhage is seen. There is no midline shift or extra-axial fluid  collection. Several small, scattered foci of T2 hyperintensity are present elsewhere in the subcortical and deep cerebral white matter. Ventricles and sulci are within normal limits for age.  Orbits are unremarkable. Paranasal sinuses and mastoid air cells are clear. Major intracranial vascular flow voids are preserved.  MRA HEAD FINDINGS  Images are mildly to moderately degraded by motion artifact.  Visualized distal vertebral arteries are patent and codominant. PICA origins are patent. Basilar artery is patent. There is apparent moderate to severe irregular narrowing of the proximal and mid basilar artery, although evaluation is limited by motion through this area and the basilar is relatively normal in appearance on whole brain T2 weighted images. Distal basilar artery is unremarkable. SCA origins are patent. There are fetal type origins of the PCAs bilaterally, with the left P1 segment being hypoplastic and the right P1 segment likely absent. No high-grade proximal PCA stenosis is seen. There is mild-to-moderate PCA branch vessel irregularity.  Internal carotid arteries are patent from skullbase to carotid termini without evidence significant stenosis. There is a 1.5 mm posteriorly directed outpouching from the distal left supraclinoid ICA. Right A1 segment is either severely hypoplastic or absent. The right A2 is supplied by the left ACA via the anterior communicating artery. No proximal MCA stenosis is seen. There is mild-to-moderate bilateral MCA branch vessel irregular narrowing.  IMPRESSION: 1. No acute or subacute large territory infarct. 2. 8 mm peripherally diffusion restricting lesion in the left centrum semiovale, indeterminate. Considerations include subacute infarct, opportunistic infection, demyelination, and metastasis. Postcontrast imaging may be helpful for further evaluation. Of note, there is no significant edema surrounding this lesion. 3. Several additional foci of white matter T2 signal  abnormality, nonspecific. Considerations include mild chronic small vessel ischemic disease, sequelae of infection, demyelination, vasculitis, and trauma. 4. Motion degraded MRA.  No major intracranial arterial occlusion. 5. Apparent moderate to severe narrowing of the proximal to mid basilar artery is felt to be at least largely in part artifactual due to motion. No significant proximal stenosis is seen in the anterior circulation. Mild-to-moderate anterior and posterior circulation branch vessel irregularity. 6. 1.5 mm infundibulum versus tiny aneurysm of the distal left ICA.   Electronically Signed   By: Sebastian Ache   On: 12/31/2013 16:30   Mr Brain Wo Contrast  12/31/2013   CLINICAL DATA:  Altered mental status.  HIV.  EXAM: MRI HEAD WITHOUT CONTRAST  MRA HEAD WITHOUT CONTRAST  TECHNIQUE: Multiplanar, multiecho pulse sequences of the brain and surrounding structures were obtained without intravenous contrast. Angiographic images of the head were obtained using MRA technique without contrast.  COMPARISON:  Head CT 12/31/2013  FINDINGS: MRI HEAD FINDINGS  Images are mildly to moderately degraded by motion artifact. Gradient echo/susceptibility weighted images were not obtained due to patient's inability to cooperate with the examination.  There is an 8 mm T2 hyperintense focus in the  left centrum semiovale which demonstrates a peripheral ring of restricted diffusion with evidence of facilitated diffusion centrally. No other restricted diffusion is seen elsewhere to indicate acute infarct. No gross intracranial hemorrhage is seen. There is no midline shift or extra-axial fluid collection. Several small, scattered foci of T2 hyperintensity are present elsewhere in the subcortical and deep cerebral white matter. Ventricles and sulci are within normal limits for age.  Orbits are unremarkable. Paranasal sinuses and mastoid air cells are clear. Major intracranial vascular flow voids are preserved.  MRA HEAD FINDINGS   Images are mildly to moderately degraded by motion artifact.  Visualized distal vertebral arteries are patent and codominant. PICA origins are patent. Basilar artery is patent. There is apparent moderate to severe irregular narrowing of the proximal and mid basilar artery, although evaluation is limited by motion through this area and the basilar is relatively normal in appearance on whole brain T2 weighted images. Distal basilar artery is unremarkable. SCA origins are patent. There are fetal type origins of the PCAs bilaterally, with the left P1 segment being hypoplastic and the right P1 segment likely absent. No high-grade proximal PCA stenosis is seen. There is mild-to-moderate PCA branch vessel irregularity.  Internal carotid arteries are patent from skullbase to carotid termini without evidence significant stenosis. There is a 1.5 mm posteriorly directed outpouching from the distal left supraclinoid ICA. Right A1 segment is either severely hypoplastic or absent. The right A2 is supplied by the left ACA via the anterior communicating artery. No proximal MCA stenosis is seen. There is mild-to-moderate bilateral MCA branch vessel irregular narrowing.  IMPRESSION: 1. No acute or subacute large territory infarct. 2. 8 mm peripherally diffusion restricting lesion in the left centrum semiovale, indeterminate. Considerations include subacute infarct, opportunistic infection, demyelination, and metastasis. Postcontrast imaging may be helpful for further evaluation. Of note, there is no significant edema surrounding this lesion. 3. Several additional foci of white matter T2 signal abnormality, nonspecific. Considerations include mild chronic small vessel ischemic disease, sequelae of infection, demyelination, vasculitis, and trauma. 4. Motion degraded MRA.  No major intracranial arterial occlusion. 5. Apparent moderate to severe narrowing of the proximal to mid basilar artery is felt to be at least largely in part  artifactual due to motion. No significant proximal stenosis is seen in the anterior circulation. Mild-to-moderate anterior and posterior circulation branch vessel irregularity. 6. 1.5 mm infundibulum versus tiny aneurysm of the distal left ICA.   Electronically Signed   By: Sebastian Ache   On: 12/31/2013 16:30   Mr Brain W Wo Contrast  01/02/2014   CLINICAL DATA:  Lower extremity weakness.  EXAM: MRI HEAD WITHOUT AND WITH CONTRAST  TECHNIQUE: Multiplanar, multiecho pulse sequences of the brain and surrounding structures were obtained without and with intravenous contrast.  CONTRAST:  15mL MULTIHANCE GADOBENATE DIMEGLUMINE 529 MG/ML IV SOLN  COMPARISON:  MRI of the brain without contrast 12/31/2013. CT head without contrast 12/31/2013.  FINDINGS: The 8 mm area of peripheral restricted diffusion in the posterior left coronal radiata is again noted. There is an additional punctate focus of restricted diffusion now evident in the subcortical white matter of the left frontal operculum.  The postcontrast images demonstrate no pathologic enhancement associated with either lesion. No pathologic enhancement is present in the brain or meninges.  IMPRESSION: 1. 8 mm focus of peripheral restricted diffusion in the left coronal radiata without enhancement. This is most compatible with an acute/subacute infarct. Infection is considered unlikely without enhancement or surrounding edema. Metastatic disease is also  unlikely without enhancement. A follow-up MRI brain without and with contrast in 1-2 months would be useful for further evaluation. 2. Additional punctate area of restricted diffusion within the left frontal operculum compatible with an acute/subacute punctate white matter infarct.   Electronically Signed   By: Gennette Pac M.D.   On: 01/02/2014 17:35   Ct Abdomen Pelvis W Contrast  12/31/2013   CLINICAL DATA:  Abdominal pain.  EXAM: CT ABDOMEN AND PELVIS WITH CONTRAST  TECHNIQUE: Multidetector CT imaging of the  abdomen and pelvis was performed using the standard protocol following bolus administration of intravenous contrast.  CONTRAST:  80mL OMNIPAQUE IOHEXOL 300 MG/ML SOLN, 50mL OMNIPAQUE IOHEXOL 300 MG/ML SOLN  COMPARISON:  02/16/2011  FINDINGS: Atelectasis in the lung bases. Suggestion of small focal area of infiltration in the right lung base. Coronary artery calcifications.  Diffuse fatty infiltration of the liver with focal more prominent fatty infiltration along the falciform ligament. The gallbladder, spleen, pancreas, adrenal glands, abdominal aorta, inferior vena cava, and retroperitoneal lymph nodes are unremarkable. Subcentimeter parenchymal cysts in the kidneys. No hydronephrosis or hydroureter. Small accessory spleen. Stomach and small bowel are normal for degree of distention. Scattered stool in the colon without distention or wall thickening. No free air or free fluid in the abdomen.  Pelvis: The appendix is normal. No evidence of diverticulitis. Bladder wall is not thickened. Prostate gland is not enlarged. No free or loculated pelvic fluid collections. No pelvic mass or lymphadenopathy. Spondylolysis with minimal spondylolisthesis at L4-5. No destructive bone lesions.  IMPRESSION: No focal acute process demonstrated in the abdomen or pelvis. Atelectasis in the lung bases with suggestion of small focal infiltration in the right lung base.   Electronically Signed   By: Burman Nieves M.D.   On: 12/31/2013 03:29    Microbiology: Recent Results (from the past 240 hour(s))  MRSA PCR SCREENING     Status: Abnormal   Collection Time    12/31/13  6:36 AM      Result Value Ref Range Status   MRSA by PCR POSITIVE (*) NEGATIVE Final   Comment:            The GeneXpert MRSA Assay (FDA     approved for NASAL specimens     only), is one component of a     comprehensive MRSA colonization     surveillance program. It is not     intended to diagnose MRSA     infection nor to guide or     monitor  treatment for     MRSA infections.     RESULT CALLED TO, READ BACK BY AND VERIFIED WITH:     [facility] RN 10:25 12/31/13 ([email or web address])     Labs: Basic Metabolic Panel:  Recent Labs Lab 12/31/13 0030 12/31/13 0636 01/01/14 0754 01/03/14 1010  NA 135* 132* 136* 135*  K 4.0 3.9 3.9 3.8  CL 95* 97 102 99  CO2 GLUCOSE 118* 116* 96 156*  BUN CREATININE 1.60* 1.41* 1.43* 1.48*  CALCIUM 10.5 8.9 8.6 8.9   Liver Function Tests:  Recent Labs Lab 12/31/13 0030 12/31/13 0636  AST 25 18  ALT 19 16  ALKPHOS 59 53  BILITOT 0.7 0.4  PROT 10.1* 8.4*  ALBUMIN 4.0 3.5    Recent Labs Lab 12/31/13 0030  LIPASE 22    Recent Labs Lab 12/31/13 0710  AMMONIA 29   CBC:  Recent Labs Lab 12/31/13 0030 12/31/13  0636 01/01/14 0754  WBC 12.4* 10.3 6.8  NEUTROABS 8.6* 6.7  --   HGB 17.2* 15.3 14.4  HCT 48.0 42.2 39.7  MCV 85.4 86.3 84.8  PLT 257 216 204   CBG:  Recent Labs Lab 12/31/13 0114  GLUCAP 102*     Signed:  Conley Canal [phone]  [facility] 01/03/2014, 2:57 PM  Attending Patient was seen, examined,treatment plan was discussed with the Physician extender. I have directly reviewed the clinical findings, lab, imaging studies and management of this patient in detail. I have made the necessary changes to the above noted documentation, and agree with the documentation, as recorded by the Physician extender.  Windell Norfolk MD [facility].

## 2014-01-03 NOTE — Progress Notes (Signed)
*  PRELIMINARY RESULTS* Vascular Ultrasound Carotid Duplex (Doppler) has been completed.  Findings suggest 1-39% internal carotid artery stenosis bilaterally. Vertebral arteries are patent with antegrade flow.  01/03/2014 11:57 AM Gertie Fey, RVT, RDCS, RDMS

## 2015-12-28 DIAGNOSIS — F1910 Other psychoactive substance abuse, uncomplicated: Secondary | ICD-10-CM | POA: Diagnosis present

## 2015-12-28 DIAGNOSIS — J449 Chronic obstructive pulmonary disease, unspecified: Secondary | ICD-10-CM | POA: Diagnosis present

## 2015-12-28 DIAGNOSIS — B20 Human immunodeficiency virus [HIV] disease: Secondary | ICD-10-CM | POA: Diagnosis present

## 2016-02-19 DIAGNOSIS — D649 Anemia, unspecified: Secondary | ICD-10-CM | POA: Diagnosis present

## 2016-04-18 IMAGING — MR MR HEAD W/O CM
8 of 10 series · 31 of 48 positions shown · non-contrast
Comparison: Head CT 12/31/2013

CLINICAL DATA: Altered mental status.  HIV.

EXAM:
MRI HEAD WITHOUT CONTRAST
MRA HEAD WITHOUT CONTRAST
TECHNIQUE: Multiplanar, multiecho pulse sequences of the brain and surrounding
structures were obtained without intravenous contrast. Angiographic
images of the head were obtained using MRA technique without
contrast.

[Series 3: DWI · axial · 5.0mm · 1.09mm/px · z∈[-58,+84]mm · 5 of 60 slices shown (1 of 4)]
[im 1/60]
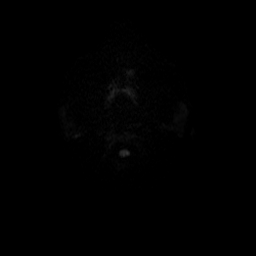
[im 15/60]
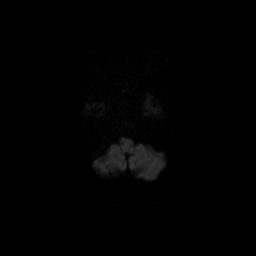
[im 30/60]
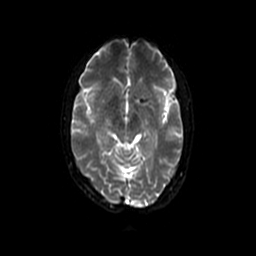
[im 45/60]
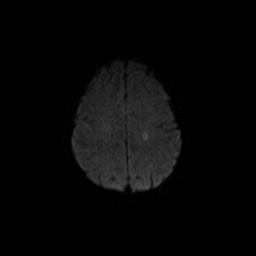
[im 60/60]
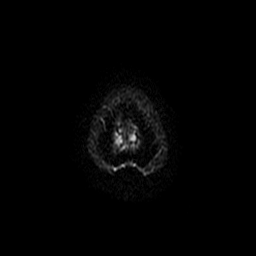

[Series 6: T2 · axial · 5.0mm · 0.43mm/px · z∈[-48,+92]mm · 3 of 25 slices shown (1 of 2)]
[im 1/25]
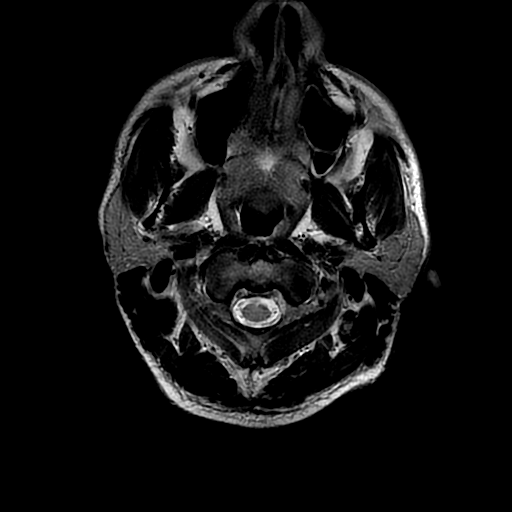
[im 13/25]
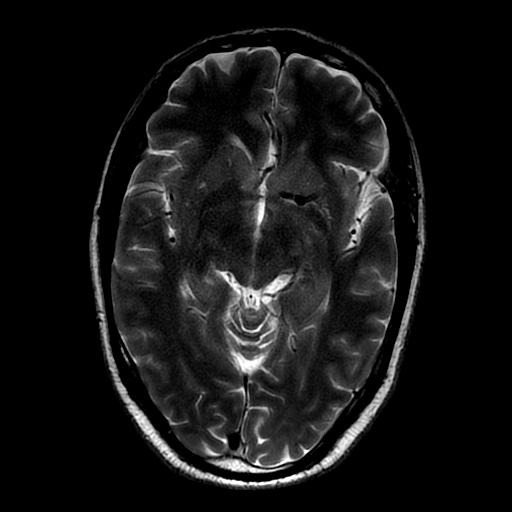
[im 25/25]
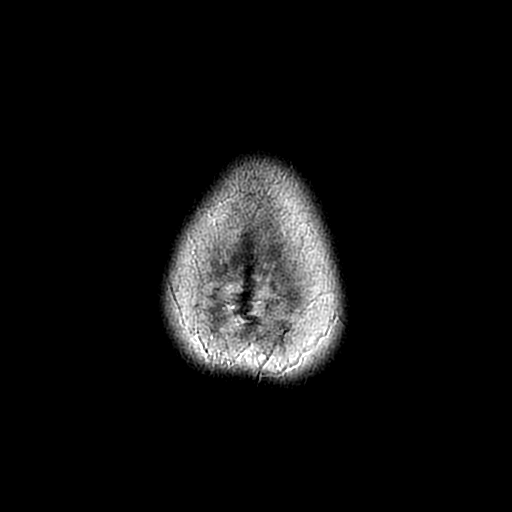

[Series 7: FLAIR · axial · 5.0mm · 0.43mm/px · z∈[-48,+92]mm · 3 of 25 slices shown (1 of 2)]
[im 1/25]
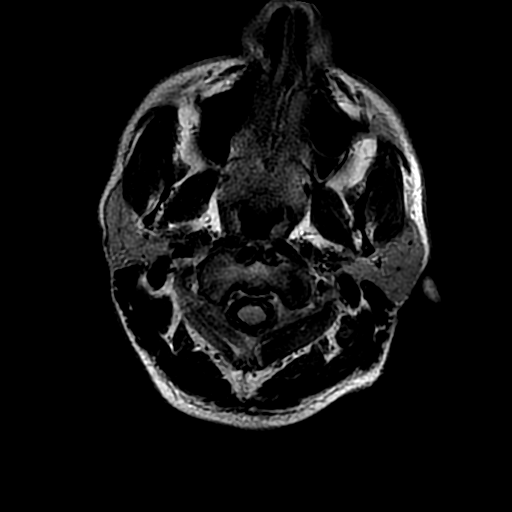
[im 13/25]
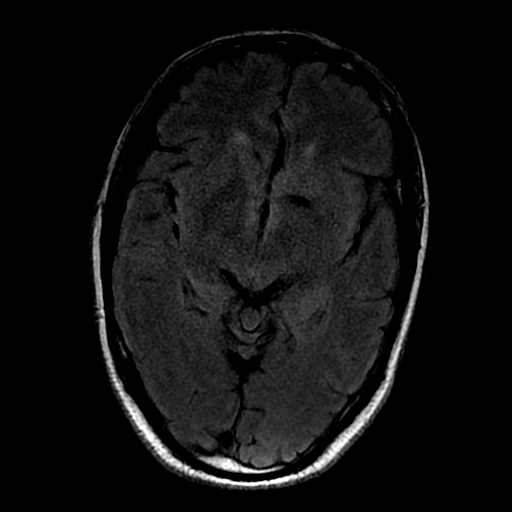
[im 25/25]
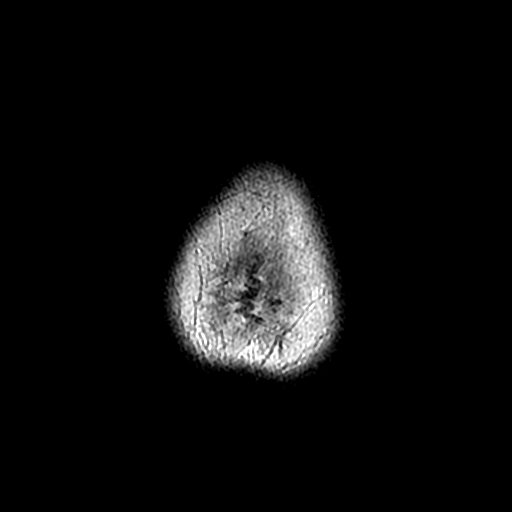

[Series 8: T2 · coronal · 5.0mm · 0.47mm/px · 2 of 30 slices shown (2 of 2)]
[im 1/30]
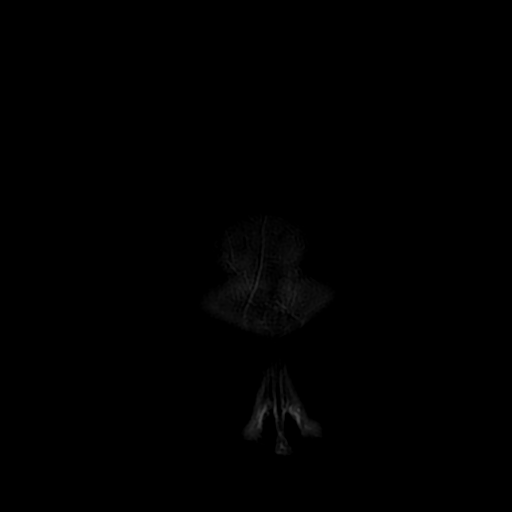
[im 15/30]
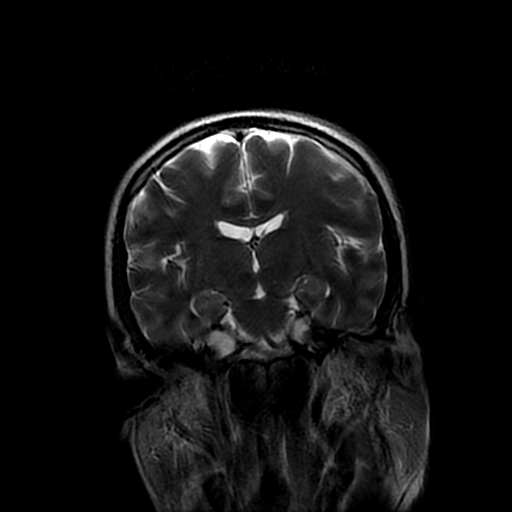

[Series 9: DWI · coronal · 5.0mm · 1.09mm/px · 8 of 76 slices shown (2 of 4)]
[im 1/76]
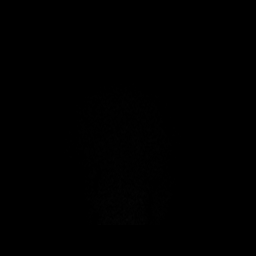
[im 11/76]
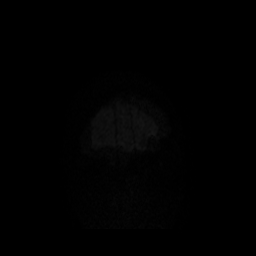
[im 22/76]
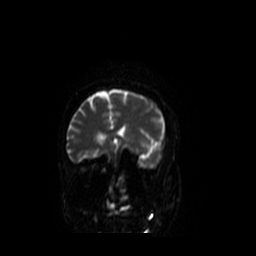
[im 33/76]
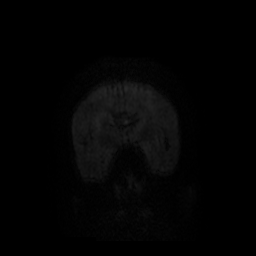
[im 43/76]
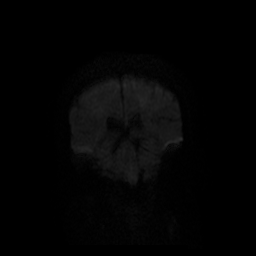
[im 54/76]
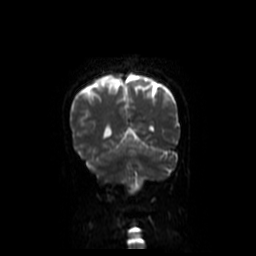
[im 65/76]
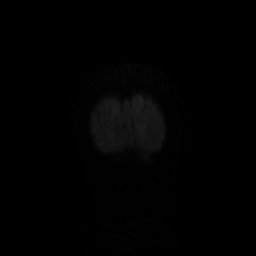
[im 76/76]
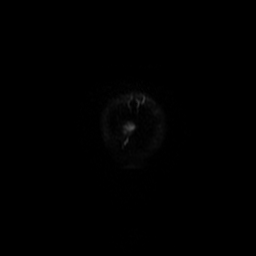

[Series 10: FLAIR · axial · 5.0mm · 0.43mm/px · z∈[-48,+92]mm · 3 of 25 slices shown (2 of 2)]
[im 1/25]
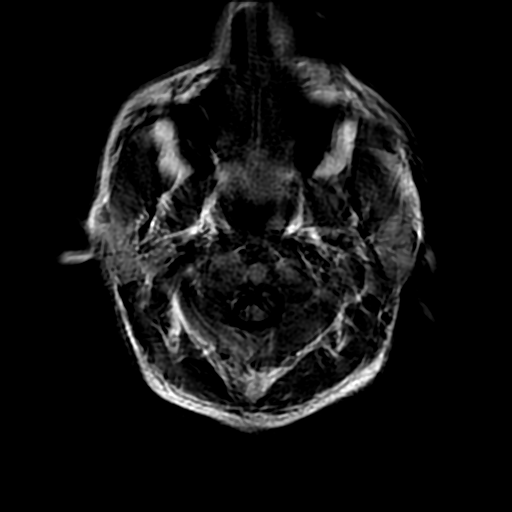
[im 13/25]
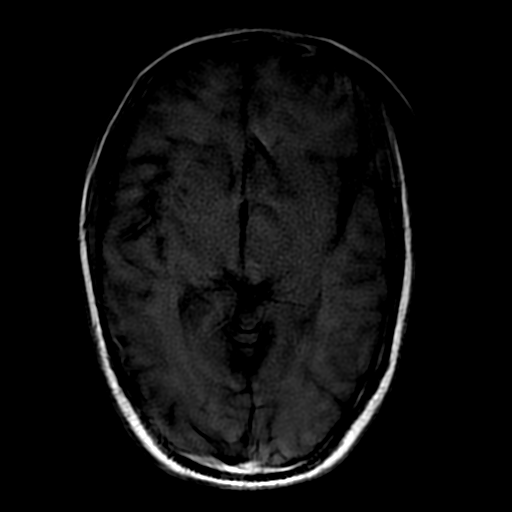
[im 25/25]
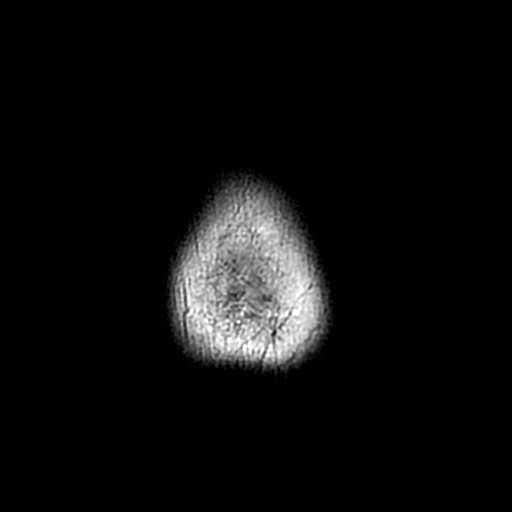

[Series 300: DWI · axial · 5.0mm · 1.09mm/px · z∈[-58,+84]mm · 3 of 30 slices shown (3 of 4)]
[im 1/30]
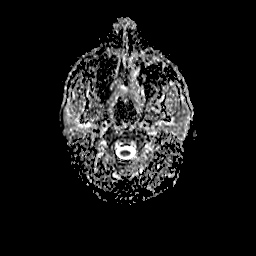
[im 15/30]
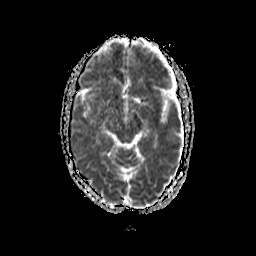
[im 30/30]
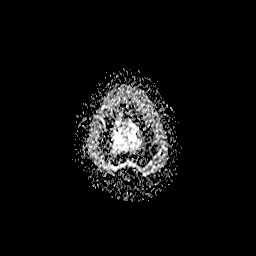

[Series 900: DWI · coronal · 5.0mm · 1.09mm/px · 4 of 38 slices shown (4 of 4)]
[im 1/38]
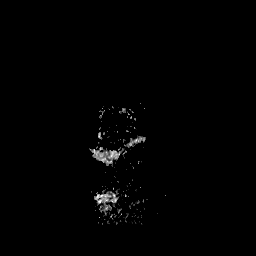
[im 13/38]
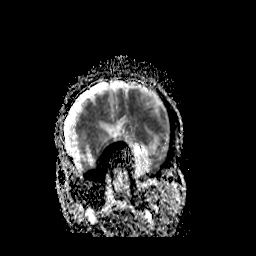
[im 25/38]
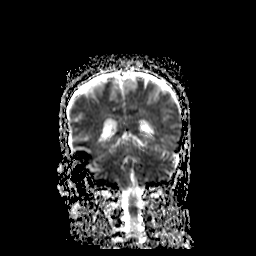
[im 38/38]
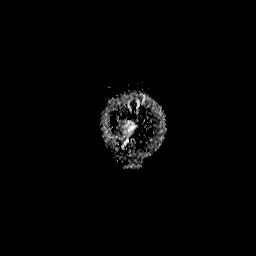

[31 of 48 positions shown; findings below may reference images not displayed]

FINDINGS: MRI HEAD FINDINGS

Images are mildly to moderately degraded by motion artifact.
Gradient echo/susceptibility weighted images were not obtained due
to patient's inability to cooperate with the examination.

There is an 8 mm T2 hyperintense focus in the left centrum semiovale
which demonstrates a peripheral ring of restricted diffusion with
evidence of facilitated diffusion centrally. No other restricted
diffusion is seen elsewhere to indicate acute infarct. No gross
intracranial hemorrhage is seen. There is no midline shift or
extra-axial fluid collection. Several small, scattered foci of T2
hyperintensity are present elsewhere in the subcortical and deep
cerebral white matter. Ventricles and sulci are within normal limits
for age.

Orbits are unremarkable. Paranasal sinuses and mastoid air cells are
clear. Major intracranial vascular flow voids are preserved.

MRA HEAD FINDINGS

Images are mildly to moderately degraded by motion artifact.

Visualized distal vertebral arteries are patent and codominant. PICA
origins are patent. Basilar artery is patent. There is apparent
moderate to severe irregular narrowing of the proximal and mid
basilar artery, although evaluation is limited by motion through
this area and the basilar is relatively normal in appearance on
whole brain T2 weighted images. Distal basilar artery is
unremarkable. SCA origins are patent. There are fetal type origins
of the PCAs bilaterally, with the left P1 segment being hypoplastic
and the right P1 segment likely absent. No high-grade proximal PCA
stenosis is seen. There is mild-to-moderate PCA branch vessel
irregularity.

Internal carotid arteries are patent from skullbase to carotid
termini without evidence significant stenosis. There is a 1.5 mm
posteriorly directed outpouching from the distal left supraclinoid
ICA. Right A1 segment is either severely hypoplastic or absent. The
right A2 is supplied by the left ACA via the anterior communicating
artery. No proximal MCA stenosis is seen. There is mild-to-moderate
bilateral MCA branch vessel irregular narrowing.
IMPRESSION: 1. No acute or subacute large territory infarct.
2. 8 mm peripherally diffusion restricting lesion in the left
centrum semiovale, indeterminate. Considerations include subacute
infarct, opportunistic infection, demyelination, and metastasis.
Postcontrast imaging may be helpful for further evaluation. Of note,
there is no significant edema surrounding this lesion.
3. Several additional foci of white matter T2 signal abnormality,
nonspecific. Considerations include mild chronic small vessel
ischemic disease, sequelae of infection, demyelination, vasculitis,
and trauma.
4. Motion degraded MRA.  No major intracranial arterial occlusion.
5. Apparent moderate to severe narrowing of the proximal to mid
basilar artery is felt to be at least largely in part artifactual
due to motion. No significant proximal stenosis is seen in the
anterior circulation. Mild-to-moderate anterior and posterior
circulation branch vessel irregularity.
6. 1.5 mm infundibulum versus tiny aneurysm of the distal left ICA.

## 2016-05-03 DIAGNOSIS — F1410 Cocaine abuse, uncomplicated: Secondary | ICD-10-CM | POA: Insufficient documentation

## 2021-11-29 ENCOUNTER — Encounter (HOSPITAL_COMMUNITY): Payer: Self-pay

## 2021-11-29 ENCOUNTER — Other Ambulatory Visit: Payer: Self-pay

## 2021-11-29 ENCOUNTER — Inpatient Hospital Stay (HOSPITAL_BASED_OUTPATIENT_CLINIC_OR_DEPARTMENT_OTHER)
Admission: EM | Admit: 2021-11-29 | Discharge: 2021-12-01 | DRG: 603 | Discharge status: Against Medical Advice | Payer: Self-pay | Attending: Internal Medicine | Admitting: Internal Medicine

## 2021-11-29 ENCOUNTER — Emergency Department (HOSPITAL_BASED_OUTPATIENT_CLINIC_OR_DEPARTMENT_OTHER): Payer: Self-pay

## 2021-11-29 ENCOUNTER — Encounter (HOSPITAL_BASED_OUTPATIENT_CLINIC_OR_DEPARTMENT_OTHER): Payer: Self-pay | Admitting: Emergency Medicine

## 2021-11-29 ENCOUNTER — Observation Stay (HOSPITAL_COMMUNITY): Payer: Self-pay

## 2021-11-29 DIAGNOSIS — J449 Chronic obstructive pulmonary disease, unspecified: Secondary | ICD-10-CM | POA: Diagnosis present

## 2021-11-29 DIAGNOSIS — Z8673 Personal history of transient ischemic attack (TIA), and cerebral infarction without residual deficits: Secondary | ICD-10-CM

## 2021-11-29 DIAGNOSIS — B9561 Methicillin susceptible Staphylococcus aureus infection as the cause of diseases classified elsewhere: Secondary | ICD-10-CM | POA: Diagnosis present

## 2021-11-29 DIAGNOSIS — L03113 Cellulitis of right upper limb: Principal | ICD-10-CM | POA: Diagnosis present

## 2021-11-29 DIAGNOSIS — L02413 Cutaneous abscess of right upper limb: Secondary | ICD-10-CM | POA: Diagnosis present

## 2021-11-29 DIAGNOSIS — I129 Hypertensive chronic kidney disease with stage 1 through stage 4 chronic kidney disease, or unspecified chronic kidney disease: Secondary | ICD-10-CM | POA: Diagnosis present

## 2021-11-29 DIAGNOSIS — I252 Old myocardial infarction: Secondary | ICD-10-CM

## 2021-11-29 DIAGNOSIS — R9431 Abnormal electrocardiogram [ECG] [EKG]: Secondary | ICD-10-CM

## 2021-11-29 DIAGNOSIS — K219 Gastro-esophageal reflux disease without esophagitis: Secondary | ICD-10-CM | POA: Diagnosis present

## 2021-11-29 DIAGNOSIS — M71121 Other infective bursitis, right elbow: Principal | ICD-10-CM

## 2021-11-29 DIAGNOSIS — L0390 Cellulitis, unspecified: Secondary | ICD-10-CM | POA: Diagnosis present

## 2021-11-29 DIAGNOSIS — F1210 Cannabis abuse, uncomplicated: Secondary | ICD-10-CM | POA: Diagnosis present

## 2021-11-29 DIAGNOSIS — Z7982 Long term (current) use of aspirin: Secondary | ICD-10-CM

## 2021-11-29 DIAGNOSIS — N1831 Chronic kidney disease, stage 3a: Secondary | ICD-10-CM | POA: Diagnosis present

## 2021-11-29 DIAGNOSIS — B20 Human immunodeficiency virus [HIV] disease: Secondary | ICD-10-CM | POA: Diagnosis present

## 2021-11-29 DIAGNOSIS — E785 Hyperlipidemia, unspecified: Secondary | ICD-10-CM | POA: Diagnosis present

## 2021-11-29 DIAGNOSIS — N179 Acute kidney failure, unspecified: Secondary | ICD-10-CM

## 2021-11-29 DIAGNOSIS — Z79899 Other long term (current) drug therapy: Secondary | ICD-10-CM

## 2021-11-29 DIAGNOSIS — M009 Pyogenic arthritis, unspecified: Secondary | ICD-10-CM | POA: Diagnosis present

## 2021-11-29 DIAGNOSIS — Z955 Presence of coronary angioplasty implant and graft: Secondary | ICD-10-CM

## 2021-11-29 DIAGNOSIS — F1410 Cocaine abuse, uncomplicated: Secondary | ICD-10-CM | POA: Diagnosis present

## 2021-11-29 DIAGNOSIS — I2510 Atherosclerotic heart disease of native coronary artery without angina pectoris: Secondary | ICD-10-CM

## 2021-11-29 DIAGNOSIS — F1910 Other psychoactive substance abuse, uncomplicated: Secondary | ICD-10-CM | POA: Diagnosis present

## 2021-11-29 DIAGNOSIS — F17210 Nicotine dependence, cigarettes, uncomplicated: Secondary | ICD-10-CM | POA: Diagnosis present

## 2021-11-29 DIAGNOSIS — D649 Anemia, unspecified: Secondary | ICD-10-CM | POA: Diagnosis present

## 2021-11-29 DIAGNOSIS — G629 Polyneuropathy, unspecified: Secondary | ICD-10-CM | POA: Diagnosis present

## 2021-11-29 HISTORY — DX: Disorder of kidney and ureter, unspecified: N28.9

## 2021-11-29 HISTORY — DX: Atherosclerotic heart disease of native coronary artery without angina pectoris: I25.10

## 2021-11-29 HISTORY — DX: Polyneuropathy, unspecified: G62.9

## 2021-11-29 LAB — COMPREHENSIVE METABOLIC PANEL
ALT: 44 U/L (ref 0–44)
AST: 42 U/L — ABNORMAL HIGH (ref 15–41)
Albumin: 2.4 g/dL — ABNORMAL LOW (ref 3.5–5.0)
Alkaline Phosphatase: 65 U/L (ref 38–126)
Anion gap: 8 (ref 5–15)
BUN: 49 mg/dL — ABNORMAL HIGH (ref 6–20)
CO2: 25 mmol/L (ref 22–32)
Calcium: 8.6 mg/dL — ABNORMAL LOW (ref 8.9–10.3)
Chloride: 103 mmol/L (ref 98–111)
Creatinine, Ser: 2.58 mg/dL — ABNORMAL HIGH (ref 0.61–1.24)
GFR, Estimated: 28 mL/min — ABNORMAL LOW (ref >60)
Glucose, Bld: 108 mg/dL — ABNORMAL HIGH (ref 70–99)
Potassium: 3.5 mmol/L (ref 3.5–5.1)
Sodium: 136 mmol/L (ref 135–145)
Total Bilirubin: 0.3 mg/dL (ref 0.3–1.2)
Total Protein: 8.4 g/dL — ABNORMAL HIGH (ref 6.5–8.1)

## 2021-11-29 LAB — URINALYSIS, ROUTINE W REFLEX MICROSCOPIC
Bilirubin Urine: NEGATIVE
Glucose, UA: NEGATIVE mg/dL
Ketones, ur: NEGATIVE mg/dL
Leukocytes,Ua: NEGATIVE
Nitrite: NEGATIVE
Protein, ur: NEGATIVE mg/dL
Specific Gravity, Urine: 1.01 (ref 1.005–1.030)
pH: 5.5 (ref 5.0–8.0)

## 2021-11-29 LAB — CBC WITH DIFFERENTIAL/PLATELET
Abs Immature Granulocytes: 0.5 10*3/uL — ABNORMAL HIGH (ref 0.00–0.07)
Basophils Absolute: 0.1 10*3/uL (ref 0.0–0.1)
Basophils Relative: 1 %
Eosinophils Absolute: 0.1 10*3/uL (ref 0.0–0.5)
Eosinophils Relative: 1 %
HCT: 35.7 % — ABNORMAL LOW (ref 39.0–52.0)
Hemoglobin: 12.5 g/dL — ABNORMAL LOW (ref 13.0–17.0)
Immature Granulocytes: 5 %
Lymphocytes Relative: 18 %
Lymphs Abs: 1.9 10*3/uL (ref 0.7–4.0)
MCH: 30.2 pg (ref 26.0–34.0)
MCHC: 35 g/dL (ref 30.0–36.0)
MCV: 86.2 fL (ref 80.0–100.0)
Monocytes Absolute: 1 10*3/uL (ref 0.1–1.0)
Monocytes Relative: 9 %
Neutro Abs: 7.3 10*3/uL (ref 1.7–7.7)
Neutrophils Relative %: 66 %
Platelets: 454 10*3/uL — ABNORMAL HIGH (ref 150–400)
RBC: 4.14 MIL/uL — ABNORMAL LOW (ref 4.22–5.81)
RDW: 14.2 % (ref 11.5–15.5)
WBC: 10.9 10*3/uL — ABNORMAL HIGH (ref 4.0–10.5)
nRBC: 0 % (ref 0.0–0.2)

## 2021-11-29 LAB — MAGNESIUM: Magnesium: 2 mg/dL (ref 1.7–2.4)

## 2021-11-29 LAB — SEDIMENTATION RATE: Sed Rate: 110 mm/hr — ABNORMAL HIGH (ref 0–16)

## 2021-11-29 LAB — URINALYSIS, MICROSCOPIC (REFLEX): Bacteria, UA: NONE SEEN

## 2021-11-29 LAB — PROTIME-INR
INR: 1.2 (ref 0.8–1.2)
Prothrombin Time: 14.6 seconds (ref 11.4–15.2)

## 2021-11-29 LAB — LACTIC ACID, PLASMA: Lactic Acid, Venous: 1.3 mmol/L (ref 0.5–1.9)

## 2021-11-29 LAB — URIC ACID: Uric Acid, Serum: 6.8 mg/dL (ref 3.7–8.6)

## 2021-11-29 LAB — C-REACTIVE PROTEIN: CRP: 17.4 mg/dL — ABNORMAL HIGH (ref <1.0)

## 2021-11-29 MED ORDER — LACTATED RINGERS IV BOLUS
1000.0000 mL | Freq: Once | INTRAVENOUS | Status: AC
  Administered 2021-11-29: 1000 mL via INTRAVENOUS

## 2021-11-29 MED ORDER — ACETAMINOPHEN 650 MG RE SUPP: 650.0000 mg | Freq: Four times a day (QID) | RECTAL | Status: DC | PRN

## 2021-11-29 MED ORDER — MORPHINE SULFATE (PF) 2 MG/ML IV SOLN
2.0000 mg | INTRAVENOUS | Status: DC | PRN
  Administered 2021-11-29 – 2021-12-01 (×4): 2 mg via INTRAVENOUS
  Filled 2021-11-29 (×4): qty 1

## 2021-11-29 MED ORDER — SODIUM CHLORIDE 0.9 % IV SOLN: INTRAVENOUS | Status: DC

## 2021-11-29 MED ORDER — ONDANSETRON HCL 4 MG/2ML IJ SOLN
4.0000 mg | Freq: Once | INTRAMUSCULAR | Status: AC
  Administered 2021-11-29: 4 mg via INTRAVENOUS
  Filled 2021-11-29: qty 2

## 2021-11-29 MED ORDER — VANCOMYCIN VARIABLE DOSE PER UNSTABLE RENAL FUNCTION (PHARMACIST DOSING): Status: DC

## 2021-11-29 MED ORDER — VANCOMYCIN HCL IN DEXTROSE 1-5 GM/200ML-% IV SOLN
1000.0000 mg | Freq: Once | INTRAVENOUS | Status: AC
Start: 2021-11-29 — End: 2021-11-29
  Administered 2021-11-29: 1000 mg via INTRAVENOUS
  Filled 2021-11-29: qty 200

## 2021-11-29 MED ORDER — MORPHINE SULFATE (PF) 4 MG/ML IV SOLN: 4.0000 mg | INTRAVENOUS | Status: DC | PRN

## 2021-11-29 MED ORDER — MORPHINE SULFATE (PF) 4 MG/ML IV SOLN
4.0000 mg | Freq: Once | INTRAVENOUS | Status: AC
  Administered 2021-11-29: 4 mg via INTRAVENOUS
  Filled 2021-11-29: qty 1

## 2021-11-29 MED ORDER — ATORVASTATIN CALCIUM 40 MG PO TABS
40.0000 mg | ORAL_TABLET | Freq: Every day | ORAL | Status: DC
  Administered 2021-11-29 – 2021-11-30 (×2): 40 mg via ORAL
  Filled 2021-11-29 (×3): qty 1

## 2021-11-29 MED ORDER — SODIUM CHLORIDE 0.9 % IV BOLUS
1000.0000 mL | Freq: Once | INTRAVENOUS | Status: AC
  Administered 2021-11-29: 1000 mL via INTRAVENOUS

## 2021-11-29 MED ORDER — BICTEGRAVIR-EMTRICITAB-TENOFOV 50-200-25 MG PO TABS
1.0000 | ORAL_TABLET | Freq: Every day | ORAL | Status: DC
  Administered 2021-11-29 – 2021-11-30 (×2): 1 via ORAL
  Filled 2021-11-29 (×3): qty 1

## 2021-11-29 MED ORDER — ACETAMINOPHEN 325 MG PO TABS
650.0000 mg | ORAL_TABLET | Freq: Four times a day (QID) | ORAL | Status: DC | PRN
  Administered 2021-11-30: 650 mg via ORAL
  Filled 2021-11-29: qty 2

## 2021-11-29 MED ORDER — ADULT MULTIVITAMIN W/MINERALS CH
1.0000 | ORAL_TABLET | Freq: Every day | ORAL | Status: DC
  Administered 2021-11-29 – 2021-11-30 (×2): 1 via ORAL
  Filled 2021-11-29 (×2): qty 1

## 2021-11-29 MED ORDER — AMITRIPTYLINE HCL 25 MG PO TABS
25.0000 mg | ORAL_TABLET | Freq: Every day | ORAL | Status: DC
  Administered 2021-11-29 – 2021-11-30 (×2): 25 mg via ORAL
  Filled 2021-11-29 (×3): qty 1

## 2021-11-29 MED ORDER — SODIUM CHLORIDE 0.9 % IV SOLN
2.0000 g | INTRAVENOUS | Status: DC
  Administered 2021-11-30: 2 g via INTRAVENOUS
  Filled 2021-11-29: qty 12.5

## 2021-11-29 MED ORDER — SODIUM CHLORIDE 0.9 % IV SOLN
2.0000 g | Freq: Once | INTRAVENOUS | Status: AC
  Administered 2021-11-29: 2 g via INTRAVENOUS
  Filled 2021-11-29: qty 12.5

## 2021-11-29 MED ORDER — SODIUM CHLORIDE 0.9 % IV SOLN: INTRAVENOUS | Status: DC | PRN

## 2021-11-29 NOTE — Progress Notes (Signed)
Pharmacy Antibiotic Note  Tony Whitaker is a 61 y.o. male admitted on 11/29/2021 with  right forearm cellulitis/bursitis .  Pharmacy has been consulted for vancomycin and cefepime dosing. PMH significant for HIV, COPD, HLD.  He received vancomycin 1000 mg IV at 09:53 and cefepime 2 g IV at 11:26 in the ED. AKI noted, SCr 2.58 (was 1.28 on 08/17/21). He is afebrile and WBC are normal.  Plan: Further vancomycin doses based on renal function Cefepime 2 g IV q24h Monitor renal function, clinical progress, cultures/sensitivities F/U LOT and de-escalate as able Vancomycin levels as clinically indicated   Height: 5\' 5"  (165.1 cm) Weight: 63 kg (138 lb 14.2 oz) IBW/kg (Calculated) : 61.5  Temp (24hrs), Avg:97.7 F (36.5 C), Min:97.5 F (36.4 C), Max:97.9 F (36.6 C)  Recent Labs  Lab 11/29/21 0850  WBC 10.9*  CREATININE 2.58*  LATICACIDVEN 1.3    Estimated Creatinine Clearance: 27.1 mL/min (A) (by C-G formula based on SCr of 2.58 mg/dL (H)).    No Known Allergies  Antimicrobials this admission: Vancomycin 8/6 >>  Cefepime 8/6 >>   Dose adjustments this admission:   Microbiology results: 8/6 BCx: pending   Thank you for involving pharmacy in this patient's care.  10/6, PharmD, BCPS Clinical Pharmacist Clinical phone for 11/29/2021 until 3p is [phone] 11/29/2021 2:34 PM

## 2021-11-29 NOTE — Assessment & Plan Note (Addendum)
No evidence of exacerbation On no SABA

## 2021-11-29 NOTE — Assessment & Plan Note (Signed)
Stable, continue to monitor  ?

## 2021-11-29 NOTE — Assessment & Plan Note (Addendum)
Follows at [facility]. Last seen in 07/2021 and had been off all anti-viral medications for >1 months, but restarted at that time. Has missed appointment since then.  Labs done on 07/2021: HIV quant >5600, CD4: 310 Continue biktarvy

## 2021-11-29 NOTE — Assessment & Plan Note (Signed)
Hold ASA for possible procedure, continue statin
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
## 2021-11-29 NOTE — ED Triage Notes (Signed)
States has been having swelling and pain to right elbow since last weekend. Right elbow extending to forearm, grossly edematous , warm and hard to touch.

## 2021-11-29 NOTE — Plan of Care (Signed)
  Problem: Education: Goal: Knowledge of General Education information will improve Description Including pain rating scale, medication(s)/side effects and non-pharmacologic comfort measures Outcome: Progressing   

## 2021-11-29 NOTE — Assessment & Plan Note (Addendum)
61 year old with hx of HIV, not fully compliant with medication, here with one week history of worsening edema/erythema and pain to left elbow with no trauma or precipitating event.  -obs to telemetry -concern for cellulitis vs. Septic arthritis/bursitis -elevated inflammatory markers -ortho consulted and following, Dr. Leanne Chang -MRI elbow pending, without contrast due to AKI  -blood cultures obtained -continue abx: vanc/cefepime -check uric acid -check UDS, denies any drug use. History of cocaine abuse in the past.  -pain control with IV morphine for now while NPO
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
## 2021-11-29 NOTE — Assessment & Plan Note (Signed)
Continue lipitor daily  ?

## 2021-11-29 NOTE — Progress Notes (Signed)
Plan of Care Note for accepted transfer   Patient: Tony Whitaker MRN: [id number]   DOA: 11/29/2021  Facility requesting transfer: [facility] Requesting Provider: Dr. Charm Barges Reason for transfer: Right forearm cellulitis Facility course: 61 yo M w/ PMHx of COPD, HIV, HLD. Presenting with right elbow pain and swelling x 1 week. Looks like he has a bursitis and proximal forearm cellulitis. EDP spoke with Dr. Frazier Butt. Keep NPO and requesting IV abx and transfer to [facility] for possible procedure. MRI ordered. Inflammatory markers ordered.   Plan of care: The patient is accepted for admission to Telemetry unit, at [facility]. While holding at [facility], medical decision making responsibilities remain with the EDP. Upon arrival to [facility], [facility] will assume care. Thank you.   Author: Teddy Spike, DO 11/29/2021  Check [email or web address] for on-call coverage.  Nursing staff, Please call [facility] Admits & Consults System-Wide number on Amion as soon as patient's arrival, so appropriate admitting provider can evaluate the pt.

## 2021-11-29 NOTE — ED Notes (Signed)
Patient reports that he gave his cell phone to one of his visitors earlier.

## 2021-11-29 NOTE — Assessment & Plan Note (Signed)
Optimize electrolytes Keep on telemetry Avoid qt prolonging drugs  Repeat ekg in AM   

## 2021-11-29 NOTE — ED Notes (Signed)
Pt aware urine specimen ordered, urinal provided

## 2021-11-29 NOTE — ED Notes (Signed)
ED Provider at bedside. 

## 2021-11-29 NOTE — Assessment & Plan Note (Addendum)
Check UDS, +drug screen in UDS per ID note Tobacco abuse: does not smoke many cigarettes, declines patch

## 2021-11-29 NOTE — Assessment & Plan Note (Addendum)
Baseline creatinine appears to be 1.2-1.4 (last labs at [facility] creatinine of 1.28 in 4/23)  Admits to daily ibuprofen use for elbow pain.  UA with 0-5 RBC  Strict I/O Hold nephrotoxic drugs Continue with gentle fluid hydration Trend
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
## 2021-11-29 NOTE — Assessment & Plan Note (Addendum)
Continue medical management with lipitor Holding lopressor with hypotension  Holding ACE-I and ASA

## 2021-11-29 NOTE — ED Provider Notes (Signed)
[facility] EMERGENCY DEPARTMENT Provider Note   CSN: [id number] Arrival date & time: 11/29/21  6294     History  Chief Complaint  Patient presents with   Arm Pain    Tony Whitaker is a 61 y.o. male.  He has a history of HIV, on retroviral's and goes to the clinic.  Left-hand-dominant.  Complaining of right elbow pain that started about a week ago.  Has been getting progressively worse.  Rates the pain as 9 out of 10 with any type of movement.  Swollen and numb from the elbow down the dorsum of his forearm to the back of his hand.  He thinks he might had a spider bite there.  He denies any injection drug use.  He does endorse tobacco and daily drinks of beer but usually not more than that.  He is not sure if he had a fever but has felt badly and has been nauseous.  He said he has had a prior boil before but not on that arm.  The history is provided by the patient.  Arm Pain This is a new problem. The current episode started more than 1 week ago. The problem occurs constantly. The problem has been gradually worsening. Pertinent negatives include no chest pain, no abdominal pain, no headaches and no shortness of breath. The symptoms are aggravated by bending and twisting. Nothing relieves the symptoms. He has tried rest for the symptoms. The treatment provided no relief.       Home Medications Prior to Admission medications   Medication Sig Start Date End Date Taking? Authorizing Provider  abacavir-lamiVUDine (EPZICOM) 600-300 MG per tablet Take 1 tablet by mouth daily.    [provider]  acetaminophen (TYLENOL) 325 MG tablet Take 2 tablets (650 mg total) by mouth every 6 (six) hours as needed for mild pain (or Fever >/= 101). 01/03/14   Dellinger, Tora Kindred, PA-C  amitriptyline (ELAVIL) 25 MG tablet Take 25 mg by mouth daily.    [provider]  aspirin 325 MG EC tablet Take 1 tablet (325 mg total) by mouth daily. 01/03/14   Dellinger, Tora Kindred, PA-C   darunavir-cobicistat (PREZCOBIX) 800-150 MG per tablet Take 1 tablet by mouth daily. Swallow whole. Do NOT crush, break or chew tablets. Take with food.    [provider]  HYDROCORTISONE EX Apply 1 application topically daily as needed (dry skin).    [provider]  ibuprofen (ADVIL,MOTRIN) 200 MG tablet Take 200-400 mg by mouth 3 (three) times daily as needed (pain).    [provider]  Multiple Vitamin (MULTIVITAMIN WITH MINERALS) TABS tablet Take 1 tablet by mouth daily. 01/03/14   Dellinger, Tora Kindred, PA-C  pravastatin (PRAVACHOL) 20 MG tablet Take 1 tablet (20 mg total) by mouth daily at 6 PM. 01/03/14   Dellinger, Tora Kindred, PA-C  PRESCRIPTION MEDICATION Inhale 2 puffs into the lungs every 6 (six) hours as needed (shortness of breath/wheezing).    [provider]      Allergies    Patient has no known allergies.    Review of Systems   Review of Systems  Constitutional:  Positive for fatigue.  Respiratory:  Negative for shortness of breath.   Cardiovascular:  Negative for chest pain.  Gastrointestinal:  Positive for nausea. Negative for abdominal pain.  Musculoskeletal:  Positive for joint swelling.  Neurological:  Negative for headaches.    Physical Exam Updated Vital Signs BP 92/68 (BP Location: Left Arm)   Pulse 94  Temp (!) 97.5 F (36.4 C) (Oral)   Resp 20   Ht 5\' 5"  (1.651 m)   Wt 62.9 kg   SpO2 94%   BMI 23.06 kg/m  Physical Exam Vitals and nursing note reviewed.  Constitutional:      General: He is not in acute distress.    Appearance: Normal appearance. He is well-developed.  HENT:     Head: Normocephalic and atraumatic.  Eyes:     Conjunctiva/sclera: Conjunctivae normal.  Cardiovascular:     Rate and Rhythm: Normal rate and regular rhythm.     Heart sounds: No murmur heard. Pulmonary:     Effort: Pulmonary effort is normal. No respiratory distress.     Breath sounds: Normal breath sounds.  Abdominal:      Palpations: Abdomen is soft.     Tenderness: There is no abdominal tenderness. There is no guarding or rebound.  Musculoskeletal:        General: Swelling and tenderness present.     Cervical back: Neck supple.     Comments: Right upper extremity nontender shoulder wrist hand.  He has significant tenderness and swelling of his right elbow with some desquamation of skin.  There is limited range of motion of the elbow secondary to pain.  His proximal forearm is tense and painful.  Distal forearm softer.  Cap refill brisk.  Able to move his fingers.   Skin:    General: Skin is warm and dry.     Capillary Refill: Capillary refill takes less than 2 seconds.  Neurological:     Mental Status: He is alert.     Sensory: Sensory deficit present.     Motor: No weakness.        ED Results / Procedures / Treatments   Labs (all labs ordered are listed, but only abnormal results are displayed) Labs Reviewed  COMPREHENSIVE METABOLIC PANEL - Abnormal; Notable for the following components:      Result Value   Glucose, Bld 108 (*)    BUN 49 (*)    Creatinine, Ser 2.58 (*)    Calcium 8.6 (*)    Total Protein 8.4 (*)    Albumin 2.4 (*)    AST 42 (*)    GFR, Estimated 28 (*)    All other components within normal limits  CBC WITH DIFFERENTIAL/PLATELET - Abnormal; Notable for the following components:   WBC 10.9 (*)    RBC 4.14 (*)    Hemoglobin 12.5 (*)    HCT 35.7 (*)    Platelets 454 (*)    Abs Immature Granulocytes 0.50 (*)    All other components within normal limits  URINALYSIS, ROUTINE W REFLEX MICROSCOPIC - Abnormal; Notable for the following components:   Hgb urine dipstick TRACE (*)    All other components within normal limits  SEDIMENTATION RATE - Abnormal; Notable for the following components:   Sed Rate 110 (*)    All other components within normal limits  CULTURE, BLOOD (ROUTINE X 2)  CULTURE, BLOOD (ROUTINE X 2)  LACTIC ACID, PLASMA  PROTIME-INR  URINALYSIS, MICROSCOPIC  (REFLEX)  C-REACTIVE PROTEIN  RAPID URINE DRUG SCREEN, HOSP PERFORMED  URIC ACID  MAGNESIUM    EKG EKG Interpretation  Date/Time:  Sunday November 29 2021 08:41:37 EDT Ventricular Rate:  91 PR Interval:  144 QRS Duration: 91 QT Interval:  397 QTC Calculation: 489 R Axis:   56 Text Interpretation: Sinus rhythm Probable left atrial enlargement Borderline prolonged QT interval No significant change since  prior 9/15 Confirmed by Meridee Score [phone]) on 11/29/2021 8:44:40 AM  Radiology DG Elbow Complete Right  Result Date: 11/29/2021 CLINICAL DATA:  Elbow pain and swelling extending into the forearm. No known injury. EXAM: RIGHT ELBOW - COMPLETE 3+ VIEW; RIGHT FOREARM - 2 VIEW COMPARISON:  None Available. FINDINGS: The mineralization and alignment are normal. There is no evidence of acute fracture or dislocation. The joint spaces are preserved. No evidence of elbow joint effusion. Fragmented spurring of the olecranon process does not appear acute. There is also mild spurring of the coronoid process. There is prominent soft tissue swelling posteriorly in the elbow, extending into the proximal forearm. No evidence of foreign body or soft tissue emphysema. IMPRESSION: 1. Dorsal soft tissue swelling at the elbow extending into the forearm, suspicious for soft tissue infection (cellulitis with possible bursitis). 2. No evidence of elbow joint effusion, acute fracture or dislocation. Electronically Signed   By: Carey Bullocks M.D.   On: 11/29/2021 09:43   DG Forearm Right  Result Date: 11/29/2021 CLINICAL DATA:  Elbow pain and swelling extending into the forearm. No known injury. EXAM: RIGHT ELBOW - COMPLETE 3+ VIEW; RIGHT FOREARM - 2 VIEW COMPARISON:  None Available. FINDINGS: The mineralization and alignment are normal. There is no evidence of acute fracture or dislocation. The joint spaces are preserved. No evidence of elbow joint effusion. Fragmented spurring of the olecranon process does not appear  acute. There is also mild spurring of the coronoid process. There is prominent soft tissue swelling posteriorly in the elbow, extending into the proximal forearm. No evidence of foreign body or soft tissue emphysema. IMPRESSION: 1. Dorsal soft tissue swelling at the elbow extending into the forearm, suspicious for soft tissue infection (cellulitis with possible bursitis). 2. No evidence of elbow joint effusion, acute fracture or dislocation. Electronically Signed   By: Carey Bullocks M.D.   On: 11/29/2021 09:43    Procedures .Critical Care  Performed by: Terrilee Files, MD Authorized by: Terrilee Files, MD   Critical care provider statement:    Critical care time (minutes):  45   Critical care time was exclusive of:  Separately billable procedures and treating other patients   Critical care was necessary to treat or prevent imminent or life-threatening deterioration of the following conditions:  Sepsis   Critical care was time spent personally by me on the following activities:  Development of treatment plan with patient or surrogate, discussions with consultants, evaluation of patient's response to treatment, examination of patient, obtaining history from patient or surrogate, ordering and performing treatments and interventions, ordering and review of laboratory studies, ordering and review of radiographic studies, pulse oximetry, re-evaluation of patient's condition and review of old charts   I assumed direction of critical care for this patient from another provider in my specialty: no       Medications Ordered in ED Medications  0.9 %  sodium chloride infusion ( Intravenous Stopped 11/29/21 1241)  vancomycin variable dose per unstable renal function (pharmacist dosing) (has no administration in time range)  ceFEPIme (MAXIPIME) 2 g in sodium chloride 0.9 % 100 mL IVPB (has no administration in time range)  0.9 %  sodium chloride infusion ( Intravenous New Bag/Given 11/29/21 1539)   acetaminophen (TYLENOL) tablet 650 mg (has no administration in time range)    Or  acetaminophen (TYLENOL) suppository 650 mg (has no administration in time range)  morphine (PF) 2 MG/ML injection 2 mg (has no administration in time range)  morphine (PF)  4 MG/ML injection 4 mg (4 mg Intravenous Given 11/29/21 1347)  sodium chloride 0.9 % bolus 1,000 mL ( Intravenous Stopped 11/29/21 1008)  ondansetron (ZOFRAN) injection 4 mg (4 mg Intravenous Given 11/29/21 1347)  vancomycin (VANCOCIN) IVPB 1000 mg/200 mL premix (0 mg Intravenous Stopped 11/29/21 1053)  ceFEPIme (MAXIPIME) 2 g in sodium chloride 0.9 % 100 mL IVPB (0 g Intravenous Stopped 11/29/21 1156)  lactated ringers bolus 1,000 mL (0 mLs Intravenous Stopped 11/29/21 1123)    ED Course/ Medical Decision Making/ A&P Clinical Course as of 11/29/21 1124  Sun Nov 29, 2021  0932 X-rays interpreted by me as no acute fracture, no significant joint effusion.  Awaiting radiology reading. [MB]  1007 Discussed with Dr. Frazier Butt hand surgery.  He would asked the patient to be admitted to [facility] campus and get an MRI.  Keep n.p.o. until decision made whether operative intervention.  Agrees with IV antibiotics.  Hospitalist paged for admission. [MB]  1020 Discussed with Dr. Ronaldo Miyamoto [facility] hospitalist who will put the patient in for a bed at [facility] campus. [MB]    Clinical Course User Index [MB] Terrilee Files, MD                           Medical Decision Making Amount and/or Complexity of Data Reviewed Labs: ordered. Radiology: ordered.  Risk Prescription drug management. Decision regarding hospitalization.  This patient complains of right elbow swelling pain nausea; this involves an extensive number of treatment Options and is a complaint that carries with it a high risk of complications and morbidity. The differential includes sepsis, Sirs, cellulitis, bursitis, septic joint, metabolic derangement, dehydration, AKI  I ordered, reviewed and interpreted  labs, which included CBC elevated white count, chemistries with new AKI, inflammatory markers elevated, lactate normal, INR normal I ordered medication IV fluids IV antibiotics pain medicine and reviewed PMP when indicated. I ordered imaging studies which included right elbow and forearm x-rays and I independently    visualized and interpreted imaging which showed soft tissue swelling no fractures Previous records obtained and reviewed in epic no recent admissions I consulted and surgery Dr. Frazier Butt and [facility] hospitalist Dr. Ronaldo Miyamoto and discussed lab and imaging findings and discussed disposition.  Cardiac monitoring reviewed, normal sinus rhythm Social determinants considered, ongoing tobacco use Critical Interventions: Initiation of IV fluids and antibiotics for SIRS criteria  After the interventions stated above, I reevaluated the patient and found patient to be symptomatically improved Admission and further testing considered, will need admission to the hospital for further management.  Patient in agreement with plan.          Final Clinical Impression(s) / ED Diagnoses Final diagnoses:  Septic bursitis of elbow, right  AKI (acute kidney injury) [facility])    Rx / DC Orders ED Discharge Orders     None         Terrilee Files, MD 11/29/21 1652

## 2021-11-30 ENCOUNTER — Encounter (HOSPITAL_COMMUNITY)
Admission: EM | Discharge status: Against Medical Advice | Payer: Self-pay | Source: Home / Self Care | Attending: Internal Medicine

## 2021-11-30 ENCOUNTER — Other Ambulatory Visit: Payer: Self-pay

## 2021-11-30 ENCOUNTER — Encounter (HOSPITAL_COMMUNITY): Payer: Self-pay

## 2021-11-30 ENCOUNTER — Observation Stay (HOSPITAL_COMMUNITY): Payer: Self-pay | Admitting: Certified Registered"

## 2021-11-30 ENCOUNTER — Observation Stay (HOSPITAL_BASED_OUTPATIENT_CLINIC_OR_DEPARTMENT_OTHER): Payer: Self-pay | Admitting: Certified Registered"

## 2021-11-30 DIAGNOSIS — I252 Old myocardial infarction: Secondary | ICD-10-CM

## 2021-11-30 DIAGNOSIS — M71021 Abscess of bursa, right elbow: Secondary | ICD-10-CM

## 2021-11-30 DIAGNOSIS — I2510 Atherosclerotic heart disease of native coronary artery without angina pectoris: Secondary | ICD-10-CM

## 2021-11-30 DIAGNOSIS — N179 Acute kidney failure, unspecified: Secondary | ICD-10-CM

## 2021-11-30 DIAGNOSIS — L02413 Cutaneous abscess of right upper limb: Secondary | ICD-10-CM

## 2021-11-30 DIAGNOSIS — N1831 Chronic kidney disease, stage 3a: Secondary | ICD-10-CM

## 2021-11-30 DIAGNOSIS — I10 Essential (primary) hypertension: Secondary | ICD-10-CM

## 2021-11-30 HISTORY — PX: I & D EXTREMITY: SHX5045

## 2021-11-30 LAB — BASIC METABOLIC PANEL
Anion gap: 7 (ref 5–15)
BUN: 22 mg/dL — ABNORMAL HIGH (ref 6–20)
CO2: 22 mmol/L (ref 22–32)
Calcium: 8.5 mg/dL — ABNORMAL LOW (ref 8.9–10.3)
Chloride: 110 mmol/L (ref 98–111)
Creatinine, Ser: 1.7 mg/dL — ABNORMAL HIGH (ref 0.61–1.24)
GFR, Estimated: 46 mL/min — ABNORMAL LOW (ref >60)
Glucose, Bld: 110 mg/dL — ABNORMAL HIGH (ref 70–99)
Potassium: 3.8 mmol/L (ref 3.5–5.1)
Sodium: 139 mmol/L (ref 135–145)

## 2021-11-30 LAB — CBC
HCT: 31.4 % — ABNORMAL LOW (ref 39.0–52.0)
Hemoglobin: 11 g/dL — ABNORMAL LOW (ref 13.0–17.0)
MCH: 30.1 pg (ref 26.0–34.0)
MCHC: 35 g/dL (ref 30.0–36.0)
MCV: 86 fL (ref 80.0–100.0)
Platelets: 447 10*3/uL — ABNORMAL HIGH (ref 150–400)
RBC: 3.65 MIL/uL — ABNORMAL LOW (ref 4.22–5.81)
RDW: 14.2 % (ref 11.5–15.5)
WBC: 10.4 10*3/uL (ref 4.0–10.5)
nRBC: 0 % (ref 0.0–0.2)

## 2021-11-30 LAB — RAPID URINE DRUG SCREEN, HOSP PERFORMED
Amphetamines: NOT DETECTED
Barbiturates: NOT DETECTED
Benzodiazepines: NOT DETECTED
Cocaine: POSITIVE — AB
Opiates: POSITIVE — AB
Tetrahydrocannabinol: POSITIVE — AB

## 2021-11-30 LAB — SURGICAL PCR SCREEN
MRSA, PCR: POSITIVE — AB
Staphylococcus aureus: POSITIVE — AB

## 2021-11-30 LAB — CK: Total CK: 131 U/L (ref 49–397)

## 2021-11-30 SURGERY — IRRIGATION AND DEBRIDEMENT EXTREMITY
Anesthesia: General | Site: Arm Upper | Laterality: Right

## 2021-11-30 MED ORDER — OXYCODONE HCL 5 MG PO TABS
ORAL_TABLET | ORAL | Status: AC
  Filled 2021-11-30: qty 1

## 2021-11-30 MED ORDER — LACTATED RINGERS IV SOLN: INTRAVENOUS | Status: DC

## 2021-11-30 MED ORDER — MIDAZOLAM HCL 2 MG/2ML IJ SOLN
INTRAMUSCULAR | Status: AC
  Filled 2021-11-30: qty 2

## 2021-11-30 MED ORDER — LIDOCAINE 2% (20 MG/ML) 5 ML SYRINGE
INTRAMUSCULAR | Status: DC | PRN
  Administered 2021-11-30: 60 mg via INTRAVENOUS

## 2021-11-30 MED ORDER — 0.9 % SODIUM CHLORIDE (POUR BTL) OPTIME
TOPICAL | Status: DC | PRN
  Administered 2021-11-30: 1000 mL

## 2021-11-30 MED ORDER — ONDANSETRON HCL 4 MG/2ML IJ SOLN
INTRAMUSCULAR | Status: DC | PRN
  Administered 2021-11-30: 4 mg via INTRAVENOUS

## 2021-11-30 MED ORDER — CHLORHEXIDINE GLUCONATE 0.12 % MT SOLN
OROMUCOSAL | Status: AC
  Administered 2021-11-30: 15 mL via OROMUCOSAL
  Filled 2021-11-30: qty 15

## 2021-11-30 MED ORDER — CHLORHEXIDINE GLUCONATE 0.12 % MT SOLN: 15.0000 mL | Freq: Once | OROMUCOSAL | Status: AC

## 2021-11-30 MED ORDER — SUGAMMADEX SODIUM 200 MG/2ML IV SOLN
INTRAVENOUS | Status: DC | PRN
  Administered 2021-11-30: 200 mg via INTRAVENOUS

## 2021-11-30 MED ORDER — FENTANYL CITRATE (PF) 100 MCG/2ML IJ SOLN
INTRAMUSCULAR | Status: AC
  Filled 2021-11-30: qty 2

## 2021-11-30 MED ORDER — FENTANYL CITRATE (PF) 250 MCG/5ML IJ SOLN
INTRAMUSCULAR | Status: AC
  Filled 2021-11-30: qty 5

## 2021-11-30 MED ORDER — ROCURONIUM BROMIDE 10 MG/ML (PF) SYRINGE
PREFILLED_SYRINGE | INTRAVENOUS | Status: DC | PRN
  Administered 2021-11-30: 50 mg via INTRAVENOUS

## 2021-11-30 MED ORDER — ORAL CARE MOUTH RINSE: 15.0000 mL | Freq: Once | OROMUCOSAL | Status: AC

## 2021-11-30 MED ORDER — OXYCODONE HCL 5 MG PO TABS
5.0000 mg | ORAL_TABLET | Freq: Once | ORAL | Status: AC | PRN
  Administered 2021-11-30: 5 mg via ORAL

## 2021-11-30 MED ORDER — DEXAMETHASONE SODIUM PHOSPHATE 10 MG/ML IJ SOLN
INTRAMUSCULAR | Status: DC | PRN
  Administered 2021-11-30: 4 mg via INTRAVENOUS

## 2021-11-30 MED ORDER — LABETALOL HCL 5 MG/ML IV SOLN
INTRAVENOUS | Status: DC | PRN
  Administered 2021-11-30: 10 mg via INTRAVENOUS

## 2021-11-30 MED ORDER — FENTANYL CITRATE (PF) 100 MCG/2ML IJ SOLN
25.0000 ug | INTRAMUSCULAR | Status: DC | PRN
  Administered 2021-11-30 (×3): 25 ug via INTRAVENOUS

## 2021-11-30 MED ORDER — PROPOFOL 10 MG/ML IV BOLUS
INTRAVENOUS | Status: DC | PRN
  Administered 2021-11-30: 200 mg via INTRAVENOUS

## 2021-11-30 MED ORDER — SODIUM CHLORIDE 0.9 % IR SOLN
Status: DC | PRN
  Administered 2021-11-30 (×2): 3000 mL

## 2021-11-30 MED ORDER — MIDAZOLAM HCL 2 MG/2ML IJ SOLN
INTRAMUSCULAR | Status: DC | PRN
  Administered 2021-11-30: 2 mg via INTRAVENOUS

## 2021-11-30 MED ORDER — MUPIROCIN 2 % EX OINT
1.0000 | TOPICAL_OINTMENT | Freq: Two times a day (BID) | CUTANEOUS | Status: DC
  Administered 2021-11-30 – 2021-12-01 (×2): 1 via NASAL
  Filled 2021-11-30: qty 22

## 2021-11-30 MED ORDER — OXYCODONE HCL 5 MG/5ML PO SOLN: 5.0000 mg | Freq: Once | ORAL | Status: AC | PRN

## 2021-11-30 MED ORDER — FENTANYL CITRATE (PF) 250 MCG/5ML IJ SOLN
INTRAMUSCULAR | Status: DC | PRN
  Administered 2021-11-30: 100 ug via INTRAVENOUS
  Administered 2021-11-30: 50 ug via INTRAVENOUS
  Administered 2021-11-30: 100 ug via INTRAVENOUS

## 2021-11-30 MED ORDER — CHLORHEXIDINE GLUCONATE CLOTH 2 % EX PADS
6.0000 | MEDICATED_PAD | Freq: Every day | CUTANEOUS | Status: DC
  Administered 2021-11-30: 6 via TOPICAL

## 2021-11-30 MED ORDER — LABETALOL HCL 5 MG/ML IV SOLN
INTRAVENOUS | Status: AC
  Filled 2021-11-30: qty 4

## 2021-11-30 SURGICAL SUPPLY — 42 items
BAG COUNTER SPONGE SURGICOUNT (BAG) ×2 IMPLANT
BNDG COHESIVE 6X5 TAN NS LF (GAUZE/BANDAGES/DRESSINGS) ×1 IMPLANT
BNDG ELASTIC 4X5.8 VLCR STR LF (GAUZE/BANDAGES/DRESSINGS) ×2 IMPLANT
BNDG GAUZE DERMACEA FLUFF (GAUZE/BANDAGES/DRESSINGS) ×2
BNDG GAUZE DERMACEA FLUFF 4 (GAUZE/BANDAGES/DRESSINGS) IMPLANT
BNDG STRETCH 4X75 STRL LF (GAUZE/BANDAGES/DRESSINGS) ×1 IMPLANT
CHLORAPREP W/TINT 26 (MISCELLANEOUS) ×2 IMPLANT
COVER MAYO STAND STRL (DRAPES) ×2 IMPLANT
COVER SURGICAL LIGHT HANDLE (MISCELLANEOUS) ×2 IMPLANT
CUFF TOURN SGL QUICK 18X4 (TOURNIQUET CUFF) ×2 IMPLANT
DRAIN PENROSE 0.25X18 (DRAIN) ×1 IMPLANT
DRAPE HALF SHEET 40X57 (DRAPES) ×2 IMPLANT
DRAPE SURG 17X23 STRL (DRAPES) ×2 IMPLANT
GAUZE SPONGE 4X4 12PLY STRL (GAUZE/BANDAGES/DRESSINGS) ×2 IMPLANT
GAUZE SPONGE 4X4 12PLY STRL LF (GAUZE/BANDAGES/DRESSINGS) ×1 IMPLANT
GAUZE XEROFORM 5X9 LF (GAUZE/BANDAGES/DRESSINGS) ×1 IMPLANT
GLOVE BIO SURGEON STRL SZ7 (GLOVE) ×2 IMPLANT
GLOVE SURG UNDER POLY LF SZ7 (GLOVE) ×2 IMPLANT
GOWN STRL REUS W/ TWL XL LVL3 (GOWN DISPOSABLE) ×2 IMPLANT
GOWN STRL REUS W/TWL XL LVL3 (GOWN DISPOSABLE) ×2
KIT BASIN OR (CUSTOM PROCEDURE TRAY) ×2 IMPLANT
KIT TURNOVER KIT B (KITS) ×2 IMPLANT
NDL HYPO 25X1 1.5 SAFETY (NEEDLE) ×1 IMPLANT
NEEDLE HYPO 25X1 1.5 SAFETY (NEEDLE) ×2 IMPLANT
NS IRRIG 1000ML POUR BTL (IV SOLUTION) ×2 IMPLANT
PACK ORTHO EXTREMITY (CUSTOM PROCEDURE TRAY) ×2 IMPLANT
PAD ABD 8X10 STRL (GAUZE/BANDAGES/DRESSINGS) ×2 IMPLANT
PAD ARMBOARD 7.5X6 YLW CONV (MISCELLANEOUS) ×2 IMPLANT
PADDING CAST ABS 3INX4YD NS (CAST SUPPLIES) ×1
PADDING CAST ABS 4INX4YD NS (CAST SUPPLIES) ×1
PADDING CAST ABS COTTON 3X4 (CAST SUPPLIES) ×1 IMPLANT
PADDING CAST ABS COTTON 4X4 ST (CAST SUPPLIES) IMPLANT
SET IRRIG Y TYPE TUR BLADDER L (SET/KITS/TRAYS/PACK) ×1 IMPLANT
SPONGE T-LAP 4X18 ~~LOC~~+RFID (SPONGE) ×2 IMPLANT
SUT ETHILON 3 0 PS 1 (SUTURE) ×2 IMPLANT
SWAB CULTURE ESWAB REG 1ML (MISCELLANEOUS) ×1 IMPLANT
SYR BULB EAR ULCER 3OZ GRN STR (SYRINGE) ×2 IMPLANT
SYR CONTROL 10ML LL (SYRINGE) ×1 IMPLANT
TOWEL GREEN STERILE (TOWEL DISPOSABLE) ×2 IMPLANT
TUBE CONNECTING 12X1/4 (SUCTIONS) ×1 IMPLANT
UNDERPAD 30X36 HEAVY ABSORB (UNDERPADS AND DIAPERS) ×2 IMPLANT
YANKAUER SUCT BULB TIP NO VENT (SUCTIONS) ×1 IMPLANT

## 2021-11-30 NOTE — Transfer of Care (Signed)
Immediate Anesthesia Transfer of Care Note  Patient: Tony Whitaker  Procedure(s) Performed: IRRIGATION AND DEBRIDEMENT RIGHT ELBOW (Right: Arm Upper)  Patient Location: PACU  Anesthesia Type:General  Level of Consciousness: awake, alert  and oriented  Airway & Oxygen Therapy: Patient Spontanous Breathing and Patient connected to nasal cannula oxygen  Post-op Assessment: Report given to RN and Post -op Vital signs reviewed and stable  Post vital signs: Reviewed and stable  Last Vitals:  Vitals Value Taken Time  BP 136/71 11/30/21 1602  Temp    Pulse 57 11/30/21 1604  Resp 15 11/30/21 1604  SpO2 98 % 11/30/21 1604  Vitals shown include unvalidated device data.  Last Pain:  Vitals:   11/30/21 1339  TempSrc:   PainSc: 10-Worst pain ever      Patients Stated Pain Goal: 3 (11/30/21 1339)  Complications: No notable events documented.

## 2021-11-30 NOTE — Anesthesia Postprocedure Evaluation (Signed)
Anesthesia Post Note  Patient: Jahari Billy  Procedure(s) Performed: IRRIGATION AND DEBRIDEMENT RIGHT ELBOW (Right: Arm Upper)     Patient location during evaluation: PACU Anesthesia Type: General Level of consciousness: awake and alert Pain management: pain level controlled Vital Signs Assessment: post-procedure vital signs reviewed and stable Respiratory status: spontaneous breathing, nonlabored ventilation and respiratory function stable Cardiovascular status: blood pressure returned to baseline and stable Postop Assessment: no apparent nausea or vomiting Anesthetic complications: no   No notable events documented.  Last Vitals:  Vitals:   11/30/21 1645 11/30/21 1700  BP: (!) 142/80 (!) 129/99  Pulse: 66 70  Resp: 16 17  Temp:  (!) 36.4 C  SpO2: 96% 95%    Last Pain:  Vitals:   11/30/21 1339  TempSrc:   PainSc: 10-Worst pain ever                 Shahzad Thomann,W. EDMOND

## 2021-11-30 NOTE — Anesthesia Preprocedure Evaluation (Signed)
Anesthesia Evaluation  Patient identified by MRN, date of birth, ID band Patient awake    Reviewed: Allergy & Precautions, H&P , NPO status , Patient's Chart, lab work & pertinent test results  Airway Mallampati: II   Neck ROM: full    Dental   Pulmonary COPD, Current Smoker,    breath sounds clear to auscultation       Cardiovascular hypertension, + CAD, + Past MI and + Cardiac Stents   Rhythm:regular Rate:Normal     Neuro/Psych  Headaches,  Neuromuscular disease    GI/Hepatic GERD  ,  Endo/Other    Renal/GU Renal Insufficiency and ARFRenal disease     Musculoskeletal  (+) Arthritis ,   Abdominal   Peds  Hematology   Anesthesia Other Findings   Reproductive/Obstetrics                             Anesthesia Physical Anesthesia Plan  ASA: 3  Anesthesia Plan: General   Post-op Pain Management:    Induction: Intravenous  PONV Risk Score and Plan: 1 and Ondansetron, Dexamethasone, Midazolam and Treatment may vary due to age or medical condition  Airway Management Planned: Oral ETT  Additional Equipment:   Intra-op Plan:   Post-operative Plan: Extubation in OR  Informed Consent: I have reviewed the patients History and Physical, chart, labs and discussed the procedure including the risks, benefits and alternatives for the proposed anesthesia with the patient or authorized representative who has indicated his/her understanding and acceptance.     Dental advisory given  Plan Discussed with: CRNA, Anesthesiologist and Surgeon  Anesthesia Plan Comments:         Anesthesia Quick Evaluation

## 2021-11-30 NOTE — Progress Notes (Signed)
PROGRESS NOTE   Tony Whitaker  [id number]    DOB: 08-02-1960    DOA: 11/29/2021  PCP: Ermalene Postin, MD   I have briefly reviewed patients previous medical records in [facility].  Chief Complaint  Patient presents with   Arm Pain    Brief Narrative:  61 year old male with PMH of HIV, COPD, HLD, HTN, CAD, CKD, history of cocaine abuse, history of CVA, ongoing tobacco use, presented to the ED on 11/29/2021 with 1 week history of right elbow pain and swelling.  He denied trauma, bite or wound.  Admitted for suspected right elbow cellulitis and possible abscess over olecranon, acute renal failure superimposed on stage IIIa chronic kidney disease and polysubstance abuse.  Orthopedics consulted and plan for I&D in OR on 8/7.   Assessment & Plan:  Principal Problem:   Cellulitis/possible septic arthritis of right elbow  Active Problems:   Acute renal failure superimposed on stage 3a chronic kidney disease (HCC)   borderline prolonged QT interval   Polysubstance abuse (HCC)   Human immunodeficiency virus (HIV) disease (HCC)   CAD (coronary artery disease)   History of stroke   Chronic obstructive pulmonary disease, unspecified (HCC)   Hyperlipidemia, unspecified   Normocytic anemia   Right elbow cellulitis with possible posterior abscess over olecranon: Orthopedics consultation and follow-up appreciated.  MRI right elbow results as detailed below but in summary, most consistent with elbow area cellulitis, concern for abscess over the olecranon and some muscle edema which may be secondary to myositis versus reactive.  Blood cultures x2: Negative to date.  Continue empirically started IV vancomycin and cefepime.  Eventually may need ID consultation for assistance.  Multimodality pain control.  CRP 17.4.  Lactate normal.  Uric acid 6.8.  Acute renal failure complicating stage IIIa CKD: Baseline creatinine 1.2-1.4.  Reported daily ibuprofen use for elbow pain.  Avoid nephrotoxic  meds.  Presented with creatinine of 2.58.  Continue IV fluids.  Creatinine improving, down to 1.7.  Follow daily BMP.  AKI may be due to NSAIDs, infectious etiology as noted above.  Urine microscopy bland.  Will check CK to rule out significant rhabdomyolysis as etiology.  [facility]  Last seen 07/2021 and had been off of antiviral medications for greater than 1 month, restarted at that time but has missed appointments since then.  Labs done 4/23: HIV quantitative >5600, CD4: 310.  Polysubstance abuse UDS positive for opiates, cocaine, THC.  Needs cessation counseling.  Declines nicotine patch.  CAD (coronary artery disease) Continue medical management with lipitor Holding lopressor with ongoing soft blood pressures and continued risk for same. Holding ACE-I and ASA due to AKI.   History of stroke Hold ASA for possible procedure, continue statin.  Resume aspirin postprocedure.   Chronic obstructive pulmonary disease, unspecified (HCC) No evidence of exacerbation On no SABA    Hyperlipidemia, unspecified Continue lipitor daily    Normocytic anemia Stable.  Prolonged QTc Resolved.  EKG 8/7 with QTc B of 421 ms.  Body mass index is 23.3 kg/m.    DVT prophylaxis: SCDs Start: 11/29/21 1457     Code Status: Full Code:  Family Communication: None at bedside Disposition:  Status is: Observation The patient will require care spanning > 2 midnights and should be moved to inpatient because: Severity of illness, intensity of care, need for surgical intervention, IV antibiotics.     Consultants:   Orthopedics.  Procedures:     Antimicrobials:   As above.  Subjective:  Seen this morning prior to procedure.  Crying that he is thirsty and wanted something to drink.  Advised him that he has to be n.p.o. for upcoming procedure otherwise that will have to be canceled and postponed.  He verbalized understanding.  Reported some right elbow area pain.  Objective:    Vitals:   11/29/21 2358 11/30/21 0504 11/30/21 0958 11/30/21 1324  BP: 107/68 107/73 123/75 116/62  Pulse: 93 80 80 85  Resp: 18 18 18 18   Temp: 98.4 F (36.9 C) 98.7 F (37.1 C) 97.9 F (36.6 C) 98 F (36.7 C)  TempSrc: Oral Oral Oral Oral  SpO2: 93% 95%  93%  Weight:    63.5 kg  Height:    5\' 5"  (1.651 m)    General exam: Young male, moderately built and nourished lying uncomfortably supine in bed without distress.  Oral mucosa with borderline hydration. Respiratory system: Clear to auscultation. Respiratory effort normal. Cardiovascular system: S1 & S2 heard, RRR. No JVD, murmurs, rubs, gallops or clicks. No pedal edema.  Telemetry personally reviewed: Sinus rhythm. Gastrointestinal system: Abdomen is nondistended, soft and nontender. No organomegaly or masses felt. Normal bowel sounds heard. Central nervous system: Alert and oriented. No focal neurological deficits. Extremities: Right upper extremity, mainly around the dorsal elbow, diffusely swollen, warm to touch,?  Fluctuant area at the tip of the elbow, mildly tender with painful range of movements.  Unable to appreciate much erythema.  Compartments soft and distal pulses well felt. Skin: No rashes, lesions or ulcers Psychiatry: Judgement and insight appear somewhat impaired. Mood & affect somewhat down and tearful..     Data Reviewed:   I have personally reviewed following labs and imaging studies   CBC: Recent Labs  Lab 11/29/21 0850 11/30/21 0815  WBC 10.9* 10.4  NEUTROABS 7.3  --   HGB 12.5* 11.0*  HCT 35.7* 31.4*  MCV 86.2 86.0  PLT 454* 447*    Basic Metabolic Panel: Recent Labs  Lab 11/29/21 0850 11/29/21 1524 11/30/21 0815  NA 136  --  139  K 3.5  --  3.8  CL 103  --  110  CO2 25  --  22  GLUCOSE 108*  --  110*  BUN 49*  --  22*  CREATININE 2.58*  --  1.70*  CALCIUM 8.6*  --  8.5*  MG  --  2.0  --     Liver Function Tests: Recent Labs  Lab 11/29/21 0850  AST 42*  ALT 44  ALKPHOS  65  BILITOT 0.3  PROT 8.4*  ALBUMIN 2.4*    CBG: No results for input(s): "GLUCAP" in the last 168 hours.  Microbiology Studies:   Recent Results (from the past 240 hour(s))  Culture, blood (Routine x 2)     Status: None (Preliminary result)   Collection Time: 11/29/21  8:50 AM   Specimen: BLOOD  Result Value Ref Range Status   Specimen Description   Final    BLOOD LEFT ANTECUBITAL Performed at [facility], [address] [place], [address] [place]    Special Requests   Final    BOTTLES DRAWN AEROBIC AND ANAEROBIC Blood Culture adequate volume Performed at [facility], [address] [place], [address] [place]    Culture   Final    NO GROWTH < 24 HOURS Performed at [facility], [address], [place], [address] [place]    Report Status PENDING  Incomplete  Culture,  blood (Routine x 2)     Status: None (Preliminary result)   Collection Time: 11/29/21  9:05 AM   Specimen: BLOOD LEFT WRIST  Result Value Ref Range Status   Specimen Description   Final    BLOOD LEFT WRIST Performed at [facility], [address] [place], [place] [address]    Special Requests   Final    BOTTLES DRAWN AEROBIC AND ANAEROBIC Blood Culture results may not be optimal due to an inadequate volume of blood received in culture bottles Performed at [facility], [address] [place], [place] [address]    Culture   Final    NO GROWTH < 24 HOURS Performed at [facility], [place] [address], [place], [place] [address]    Report Status PENDING  Incomplete  Surgical pcr screen     Status: Abnormal   Collection Time: 11/30/21  3:30 AM   Specimen: Nasal Mucosa; Nasal Swab  Result Value Ref Range Status   MRSA, PCR POSITIVE (A) NEGATIVE Final    Comment: RESULT CALLED TO, READ BACK BY AND VERIFIED WITH: K GENGLER,RN@0510  11/30/21 [place]    Staphylococcus aureus POSITIVE (A) NEGATIVE Final    Comment: (NOTE) The Xpert SA Assay (FDA approved  for NASAL specimens in patients 70 years of age and older), is one component of a comprehensive surveillance program. It is not intended to diagnose infection nor to guide or monitor treatment. Performed at [facility], [place] [address] [place], [place] [address]     Radiology Studies:  MR ELBOW RIGHT WO CONTRAST  Result Date: 11/30/2021 CLINICAL DATA:  Pain and swelling of the elbow. EXAM: MRI OF THE RIGHT ELBOW WITHOUT CONTRAST TECHNIQUE: Multiplanar, multisequence MR imaging of the elbow was performed. No intravenous contrast was administered. COMPARISON:  None Available. FINDINGS: TENDONS Common forearm flexor origin: Intact Common forearm extensor origin: Mild tendinosis of the common extensor tendon origin. Biceps: Intact Triceps: Intact LIGAMENTS Medial stabilizers: Intact Lateral stabilizers:  Intact Cartilage: Generalized chondral thinning. Joint: No joint effusion. No synovitis. Cubital tunnel: Normal. Bones: No fracture or dislocation. No marrow abnormality. Soft Tissues: Severe soft tissue edema overlying the posterior aspect of the elbow and circumferentially involving the forearm most consistent with cellulitis. Ill-defined fluid collection overlying the olecranon concerning for an abscess. Mild muscle edema in the brachioradialis muscle and flexor digitorum profundus muscle which may be reactive versus secondary to mild myositis. If there is further clinical concern, recommend an MRI of the elbow with intravenous contrast. IMPRESSION: 1. Severe soft tissue edema overlying the posterior aspect of the elbow and circumferentially involving the forearm most consistent with cellulitis. Ill-defined fluid collection overlying the olecranon concerning for an abscess. If there is further clinical concern, recommend an MRI of the elbow with intravenous contrast. 2. Mild muscle edema in the brachioradialis muscle and flexor digitorum profundus muscle which may be reactive versus secondary to  mild myositis. 3. Mild tendinosis of the common extensor tendon origin. Electronically Signed   By: Kathreen Devoid M.D.   On: 11/30/2021 06:34   DG Elbow Complete Right  Result Date: 11/29/2021 CLINICAL DATA:  Elbow pain and swelling extending into the forearm. No known injury. EXAM: RIGHT ELBOW - COMPLETE 3+ VIEW; RIGHT FOREARM - 2 VIEW COMPARISON:  None Available. FINDINGS: The mineralization and alignment are normal. There is no evidence of acute fracture or dislocation. The joint spaces are preserved. No evidence of elbow joint effusion. Fragmented spurring of  the olecranon process does not appear acute. There is also mild spurring of the coronoid process. There is prominent soft tissue swelling posteriorly in the elbow, extending into the proximal forearm. No evidence of foreign body or soft tissue emphysema. IMPRESSION: 1. Dorsal soft tissue swelling at the elbow extending into the forearm, suspicious for soft tissue infection (cellulitis with possible bursitis). 2. No evidence of elbow joint effusion, acute fracture or dislocation. Electronically Signed   By: Carey Bullocks M.D.   On: 11/29/2021 09:43   DG Forearm Right  Result Date: 11/29/2021 CLINICAL DATA:  Elbow pain and swelling extending into the forearm. No known injury. EXAM: RIGHT ELBOW - COMPLETE 3+ VIEW; RIGHT FOREARM - 2 VIEW COMPARISON:  None Available. FINDINGS: The mineralization and alignment are normal. There is no evidence of acute fracture or dislocation. The joint spaces are preserved. No evidence of elbow joint effusion. Fragmented spurring of the olecranon process does not appear acute. There is also mild spurring of the coronoid process. There is prominent soft tissue swelling posteriorly in the elbow, extending into the proximal forearm. No evidence of foreign body or soft tissue emphysema. IMPRESSION: 1. Dorsal soft tissue swelling at the elbow extending into the forearm, suspicious for soft tissue infection (cellulitis with  possible bursitis). 2. No evidence of elbow joint effusion, acute fracture or dislocation. Electronically Signed   By: Carey Bullocks M.D.   On: 11/29/2021 09:43    Scheduled Meds:    [MAR Hold] amitriptyline  25 mg Oral Daily   [MAR Hold] atorvastatin  40 mg Oral Daily   [MAR Hold] bictegravir-emtricitabine-tenofovir AF  1 tablet Oral Daily   [MAR Hold] Chlorhexidine Gluconate Cloth  6 each Topical Q0600   [MAR Hold] multivitamin with minerals  1 tablet Oral Daily   [MAR Hold] mupirocin ointment  1 Application Nasal BID   [MAR Hold] vancomycin variable dose per unstable renal function (pharmacist dosing)   Does not apply See admin instructions    Continuous Infusions:    [MAR Hold] sodium chloride Stopped (11/29/21 1241)   sodium chloride 75 mL/hr at 11/30/21 0324   [MAR Hold] ceFEPime (MAXIPIME) IV 2 g (11/30/21 1154)   lactated ringers 10 mL/hr at 11/30/21 1342     LOS: 0 days     Marcellus Scott, MD,  FACP, [facility], [facility], [facility] (Care Management Physician Certified) [facility] Ad[facility] [place]  To contact the attending provider between 7A-7P or the covering provider during after hours 7P-7A, please log into the web site [email or web address] and access using universal [place] password for that web site. If you do not have the password, please call the hospital operator.  11/30/2021, 2:53 PM

## 2021-11-30 NOTE — Progress Notes (Signed)
   Subjective:  No acute events overnight.  Resting comfortably this AM.  Pain in posterior elbow is largely unchanged.  Denies fevers, chills, chest pain, SOB overnight.   Objective:   VITALS:   Vitals:   11/29/21 2007 11/29/21 2007 11/29/21 2358 11/30/21 0504  BP: 113/74  107/68 107/73  Pulse: 75 78 93 80  Resp: 18  18 18   Temp: 98.6 F (37 C)  98.4 F (36.9 C) 98.7 F (37.1 C)  TempSrc: Oral  Oral Oral  SpO2: (!) 81% 97% 93% 95%  Weight:      Height:        Gen: NAD, sleeping comfortably Pulm: Normal WOB on RA CV: Normal rate RUE: Remains TTP over posterior elbow and over common extensor tendons, possible fluctuance over posterior elbow, no pain in mid to distal forearm/wrist/hand, full and painless ROM of fingers    Lab Results  Component Value Date   WBC 10.9 (H) 11/29/2021   HGB 12.5 (L) 11/29/2021   HCT 35.7 (L) 11/29/2021   MCV 86.2 11/29/2021   PLT 454 (H) 11/29/2021     Assessment/Plan:    61 yo M w/ right elbow cellulitis with possible posterior abscess over olecranon.  MRI overnight demonstrates severe cellulitis with likely posterior abscess Will plan for I&D in OR today Will take intraop cultures but likely MRSA given history and PCR screening Please keep NPO for OR Dispo pending surgery and final abx choice   41, MD 11/30/2021, 8:11 AM (828) 01/30/2022

## 2021-11-30 NOTE — Interval H&P Note (Signed)
History and Physical Interval Note:  11/30/2021 10:29 AM  Tony Whitaker  has presented today for surgery, with the diagnosis of infected right elbow.  The various methods of treatment have been discussed with the patient and family. After consideration of risks, benefits and other options for treatment, the patient has consented to  Procedure(s): IRRIGATION AND DEBRIDEMENT RIGHT ELBOW (Right) as a surgical intervention.  The patient's history has been reviewed, patient examined, no change in status, stable for surgery.  I have reviewed the patient's chart and labs.  Questions were answered to the patient's satisfaction.     Sherrika Weakland Chez Bulnes

## 2021-11-30 NOTE — Progress Notes (Signed)
Patient transported via bed to surgery by patient transport. No signs of acute distress noted or voiced.

## 2021-11-30 NOTE — TOC Initial Note (Signed)
Transition of Care [facility]) - Initial/Assessment Note    Patient Details  Name: Tony Whitaker MRN: [id number] Date of Birth: [birth year]  Transition of Care [facility]) CM/SW Contact:    Tom-Johnson, Hershal Coria, RN Phone Number: 11/30/2021, 4:12 PM  Clinical Narrative:                  CM spoke with patient at bedside about needs for post hospital transition. Admitted for Rt elbow Cellulitis. Scheduled I&D today. On IV abx.  From home with mother and sister. Has one son, not supportive.  Does not drive, sister drives to and from appointments and does errands. Currently not employed and not on disability.  PCP is Reginold Agent, Carmelina Peal, MD and uses [facility] on [address] in [place].  Patient noted to not have Medical Insurance. MATCH done for prescription assistance.  CM will continue to follow with needs as patient progresses with care.    Barriers to Discharge: Continued Medical Work up   Patient Goals and CMS Choice Patient states their goals for this hospitalization and ongoing recovery are:: To return home CMS Medicare.gov Compare Post Acute Care list provided to:: Patient Choice offered to / list presented to : NA  Expected Discharge Plan and Services     Discharge Planning Services: CM Consult Post Acute Care Choice: NA Living arrangements for the past 2 months: Single Family Home                                      Prior Living Arrangements/Services Living arrangements for the past 2 months: Single Family Home Lives with:: Spouse, Siblings (Sister) Patient language and need for interpreter reviewed:: Yes Do you feel safe going back to the place where you live?: Yes      Need for Family Participation in Patient Care: Yes (Comment) Care giver support system in place?: Yes (comment)   Criminal Activity/Legal Involvement Pertinent to Current Situation/Hospitalization: No - Comment as needed  Activities of Daily Living Home Assistive Devices/Equipment:  None, Eyeglasses, Cane (specify quad or straight) ADL Screening (condition at time of admission) Patient's cognitive ability adequate to safely complete daily activities?: Yes Is the patient deaf or have difficulty hearing?: No Does the patient have difficulty seeing, even when wearing glasses/contacts?: No Does the patient have difficulty concentrating, remembering, or making decisions?: No Patient able to express need for assistance with ADLs?: Yes Does the patient have difficulty dressing or bathing?: No Independently performs ADLs?: Yes (appropriate for developmental age) Does the patient have difficulty walking or climbing stairs?: No Weakness of Legs: None Weakness of Arms/Hands: Right  Permission Sought/Granted Permission sought to share information with : Case Manager, Family Supports, Magazine features editor Permission granted to share information with : Yes, Verbal Permission Granted              Emotional Assessment Appearance:: Appears stated age Attitude/Demeanor/Rapport: Engaged, Gracious Affect (typically observed): Accepting, Appropriate, Calm, Hopeful, Pleasant Orientation: : Oriented to Self, Oriented to Place, Oriented to  Time, Oriented to Situation Alcohol / Substance Use: Not Applicable Psych Involvement: No (comment)  Admission diagnosis:  Cellulitis [L03.90] AKI (acute kidney injury) (HCC) [N17.9] Septic bursitis of elbow, right [M71.121] Patient Active Problem List   Diagnosis Date Noted   Cellulitis/possible septic arthritis of right elbow  11/29/2021   Acute renal failure superimposed on stage 3a chronic kidney disease (HCC) 11/29/2021   CAD (coronary artery  disease) 11/29/2021   History of stroke 11/29/2021   borderline prolonged QT interval 11/29/2021   Cocaine abuse (HCC) 05/03/2016   Normocytic anemia 02/19/2016   Chronic obstructive pulmonary disease, unspecified (HCC) 12/28/2015   Human immunodeficiency virus (HIV) disease (HCC)  12/28/2015   Polysubstance abuse (HCC) 12/28/2015   CKD (chronic kidney disease) stage 3, GFR 30-59 ml/min (HCC) 12/31/2013   Abnormal finding on MRI of brain 12/31/2013   Hyperlipidemia, unspecified 05/24/2013   PCP:  Cira Servant, MD Pharmacy:   [facility] [phone] Archie Patten, [place] - [address] [facility] [address] [place] [address] Phone: [phone] Fax: [phone]     Social Determinants of Health (SDOH) Interventions    Readmission Risk Interventions     No data to display

## 2021-11-30 NOTE — Anesthesia Procedure Notes (Signed)
Procedure Name: Intubation Date/Time: 11/30/2021 2:47 PM  Performed by: Anastasio Auerbach, CRNAPre-anesthesia Checklist: Patient identified, Emergency Drugs available, Suction available and Patient being monitored Patient Re-evaluated:Patient Re-evaluated prior to induction Oxygen Delivery Method: Circle system utilized Preoxygenation: Pre-oxygenation with 100% oxygen Induction Type: IV induction Ventilation: Mask ventilation without difficulty Laryngoscope Size: Mac and 3 Grade View: Grade I Tube type: Oral Number of attempts: 1 Airway Equipment and Method: Stylet and Oral airway Placement Confirmation: ETT inserted through vocal cords under direct vision, positive ETCO2 and breath sounds checked- equal and bilateral Secured at: 22 cm Tube secured with: Tape Dental Injury: Teeth and Oropharynx as per pre-operative assessment

## 2021-11-30 NOTE — Op Note (Signed)
   Date of Surgery: 11/30/2021  INDICATIONS: Patient is a 61 y.o.-year-old male with atraumatic right posterior elbow pain for the last week.  He was admitted to the hospitalist service with concern for cellulitis and possible posterior elbow abscess.  MRI done overnight suggests severe cellulitis with posterior abscess.  Risks, benefits, and alternatives to surgery were again discussed with the patient in the preoperative area. The patient wishes to proceed with surgery.  Informed consent was signed after our discussion.   PREOPERATIVE DIAGNOSIS:  Right posterior elbow abscess  POSTOPERATIVE DIAGNOSIS: Same.  PROCEDURE:  Irrigation and debridement of right subcutaneous elbow abscess   SURGEON: Audria Nine, M.D.  ASSIST:   ANESTHESIA:  general  IV FLUIDS AND URINE: See anesthesia.  ESTIMATED BLOOD LOSS: 10 mL.  IMPLANTS: * No implants in log *   DRAINS: Penrose x 2  COMPLICATIONS: None  DESCRIPTION OF PROCEDURE: The patient was met in the preoperative holding area where the surgical site was marked and the consent form was verified.  The patient was then taken to the operating room and transferred to the operating table.  All bony prominences were well padded.  The operative extremity was prepped and draped in the usual and sterile fashion.  A formal time-out was performed to confirm that this was the correct patient, surgery, side, and site.   Following timeout, the limb was elevated and the tourniquet inflated to 250 mmHg.  An approximately 10 cm longitudinal incision was made at the posterior aspect of the elbow curving around the the lateral side of the olecranon.  Bovie electrocautery was used to open the subcutaneous tissue.  There was immediate frank purulence from the wound.  Blunt dissection was used to break up any potential loculations at the posterior lateral aspect of the elbow and proximal forearm.  There was copious purulent fluid.  Aerobic and anaerobic samples were  taken for microbiology.  The fascia overlying the common extensor muscles was intact.  There was undermining of the skin from the area of the olecranon distally into the proximal third of the lateral elbow.  A counterincision was made at the lateral aspect of the proximal third of the forearm.  After all potential loculations broken up, the wound was thoroughly irrigated with copious sterile saline via low flow cystoscopy tubing.  The wound was closed using a 3-0 nylon suture in simple and horizontal mattress fashion.  1 Penrose drain was placed in the lateral incision and 2 Penrose drains were placed in the posterior incision.  The tourniquet was deflated and hemostasis was achieved with direct pressure over the wound.  The hand was warm, pink, and well-perfused and at the end the procedure.  There was dressed with Xeroform, folded Kerlix, ABD pads, cast padding, and Ace wrap.  The patient was reversed from anesthesia and extubated uneventfully.  He was transferred to the postoperative bed.  All counts were correct at the end of the procedure.  The patient was taken to the PACU in stable condition.   POSTOPERATIVE PLAN: Patient to be transferred to his room back on the floor.  Penrose drains can come out in 24 to 48 hours.  Final disposition pending culture results and final antibiotic choice.  Audria Nine, MD 4:31 PM

## 2021-12-01 ENCOUNTER — Encounter (HOSPITAL_COMMUNITY): Payer: Self-pay | Admitting: Orthopedic Surgery

## 2021-12-01 DIAGNOSIS — L02413 Cutaneous abscess of right upper limb: Secondary | ICD-10-CM

## 2021-12-01 MED ORDER — ONDANSETRON HCL 4 MG/2ML IJ SOLN
4.0000 mg | Freq: Four times a day (QID) | INTRAMUSCULAR | Status: DC | PRN
  Administered 2021-12-01: 4 mg via INTRAVENOUS
  Filled 2021-12-01: qty 2

## 2021-12-01 NOTE — Progress Notes (Addendum)
[facility] night coverage note:  Called by RN at [phone].  Pt leaving AMA, he feels he is withdrawing from heroin he tells RN, despite ordered morphine (and despite not even using all the ordered morphine, only used 4 doses in nearly 36h despite it being ordered Q3H PRN).  Regardless, before I could have a discussion with patient (less than 8 mins after RN informed me he wanted to leave AMA) he had already left the unit per RN.  Apparently he was already dressed and had removed his IV at time he had told RN desire to leave AMA.

## 2021-12-01 NOTE — Discharge Summary (Addendum)
Physician Discharge Summary  Tony Whitaker [id number] DOB: 1961-01-03  PCP: Ermalene Postin, MD  Patient left the hospital Against Medical Advise  Admitted from: Home Discharged to: left AMA  Admit date: 11/29/2021 Discharge date: 12/01/2021  Recommendations for Outpatient Follow-up:  None made. He left AMA prior to medical optimization and formal DC    Home Health: N/A    Equipment/Devices: N/A    Discharge Condition: Guarded with high risk of decline including death.    Code Status: Full Code Diet recommendation:     Discharge Diagnoses:  Principal Problem:   Cellulitis/possible septic arthritis of right elbow  Active Problems:   Acute renal failure superimposed on stage 3a chronic kidney disease (HCC)   borderline prolonged QT interval   Polysubstance abuse (HCC)   Human immunodeficiency virus (HIV) disease (HCC)   CAD (coronary artery disease)   History of stroke   Chronic obstructive pulmonary disease, unspecified (HCC)   Hyperlipidemia, unspecified   Normocytic anemia   Brief Summary: 61 year old male with PMH of HIV, COPD, HLD, HTN, CAD, CKD, history of cocaine abuse, history of CVA, ongoing tobacco use, presented to the ED on 11/29/2021 with 1 week history of right elbow pain and swelling.  He denied trauma, bite or wound.  Admitted for suspected right elbow cellulitis and possible abscess over olecranon, acute renal failure superimposed on stage IIIa chronic kidney disease and polysubstance abuse.  Orthopedics consulted and s/p for I&D of  right subcutaneous elbow abscess on 8/7.  Overnight 8/7 (early am of 8/8): as per night [facility] MD report, patient decided to leave AMA. His documentation as below:  "TR[facility] night coverage note:   Called by RN at [phone].  Pt leaving AMA, he feels he is withdrawing from heroin he tells RN, despite ordered morphine (and despite not even using all the ordered morphine, only used 4 doses in nearly 36h despite it being ordered Q3H  PRN).   Regardless, before I could have a discussion with patient (less than 8 mins after RN informed me he wanted to leave AMA) he had already left the unit per RN.  Apparently he was already dressed and had removed his IV at time he had told RN desire to leave [facility]."  Hospital course as noted below..     Assessment & Plan:   Right elbow cellulitis with possible posterior abscess over olecranon: Orthopedics consultation and follow-up appreciated.  MRI right elbow results as detailed below but in summary, most consistent with elbow area cellulitis, concern for abscess over the olecranon and some muscle edema which may be secondary to myositis versus reactive.  Blood cultures x2: Negative to date.  Was empirically started IV vancomycin and cefepime.  Eventually may need ID consultation for assistance.  Multimodality pain control.  CRP 17.4.  Lactate normal.  Uric acid 6.8.  Orthopedics consulted and s/p for I&D of  right subcutaneous elbow abscess on 8/7.   Acute renal failure complicating stage IIIa CKD: Baseline creatinine 1.2-1.4.  Reported daily ibuprofen use for elbow pain.  Avoid nephrotoxic meds.  Presented with creatinine of 2.58. Treated with IV fluids.  Creatinine improving, down to 1.7.  Follow daily BMP.  AKI may be due to NSAIDs, infectious etiology as noted above.  Urine microscopy bland.  CK normal.  [facility] disease [facility]  Per H&P, "Last seen 07/2021 and had been off of antiviral medications for greater than 1 month, restarted at that time but has missed appointments since then.  Labs done 4/23:  HIV quantitative >5600, CD4: 310."  Polysubstance abuse UDS positive for opiates, cocaine, THC.  Needs cessation counseling.  Declines nicotine patch.   CAD (coronary artery disease) Continued medical management with lipitor Held lopressor with ongoing soft blood pressures and continued risk for same. Held ACE-I and ASA due to AKI.   History of stroke Held ASA for possible  procedure, continue statin.  Resume aspirin postprocedure.   Chronic obstructive pulmonary disease, unspecified (HCC) No evidence of exacerbation On no SABA    Hyperlipidemia, unspecified Continued lipitor daily    Normocytic anemia Stable.   Prolonged QTc Resolved.  EKG 8/7 with QTc B of 421 ms.   Body mass index is 23.3 kg/m.     Consultants:   Orthopedics.   Procedures:    Irrigation and debridement of right subcutaneous elbow abscess on 11/30/2021  Discharge Instructions Patient left AMA  No Known Allergies    Procedures/Studies: MR ELBOW RIGHT WO CONTRAST  Result Date: 11/30/2021 CLINICAL DATA:  Pain and swelling of the elbow. EXAM: MRI OF THE RIGHT ELBOW WITHOUT CONTRAST TECHNIQUE: Multiplanar, multisequence MR imaging of the elbow was performed. No intravenous contrast was administered. COMPARISON:  None Available. FINDINGS: TENDONS Common forearm flexor origin: Intact Common forearm extensor origin: Mild tendinosis of the common extensor tendon origin. Biceps: Intact Triceps: Intact LIGAMENTS Medial stabilizers: Intact Lateral stabilizers:  Intact Cartilage: Generalized chondral thinning. Joint: No joint effusion. No synovitis. Cubital tunnel: Normal. Bones: No fracture or dislocation. No marrow abnormality. Soft Tissues: Severe soft tissue edema overlying the posterior aspect of the elbow and circumferentially involving the forearm most consistent with cellulitis. Ill-defined fluid collection overlying the olecranon concerning for an abscess. Mild muscle edema in the brachioradialis muscle and flexor digitorum profundus muscle which may be reactive versus secondary to mild myositis. If there is further clinical concern, recommend an MRI of the elbow with intravenous contrast. IMPRESSION: 1. Severe soft tissue edema overlying the posterior aspect of the elbow and circumferentially involving the forearm most consistent with cellulitis. Ill-defined fluid collection overlying the  olecranon concerning for an abscess. If there is further clinical concern, recommend an MRI of the elbow with intravenous contrast. 2. Mild muscle edema in the brachioradialis muscle and flexor digitorum profundus muscle which may be reactive versus secondary to mild myositis. 3. Mild tendinosis of the common extensor tendon origin. Electronically Signed   By: Elige Ko M.D.   On: 11/30/2021 06:34   DG Elbow Complete Right  Result Date: 11/29/2021 CLINICAL DATA:  Elbow pain and swelling extending into the forearm. No known injury. EXAM: RIGHT ELBOW - COMPLETE 3+ VIEW; RIGHT FOREARM - 2 VIEW COMPARISON:  None Available. FINDINGS: The mineralization and alignment are normal. There is no evidence of acute fracture or dislocation. The joint spaces are preserved. No evidence of elbow joint effusion. Fragmented spurring of the olecranon process does not appear acute. There is also mild spurring of the coronoid process. There is prominent soft tissue swelling posteriorly in the elbow, extending into the proximal forearm. No evidence of foreign body or soft tissue emphysema. IMPRESSION: 1. Dorsal soft tissue swelling at the elbow extending into the forearm, suspicious for soft tissue infection (cellulitis with possible bursitis). 2. No evidence of elbow joint effusion, acute fracture or dislocation. Electronically Signed   By: Carey Bullocks M.D.   On: 11/29/2021 09:43   DG Forearm Right  Result Date: 11/29/2021 CLINICAL DATA:  Elbow pain and swelling extending into the forearm. No known injury. EXAM: RIGHT ELBOW -  COMPLETE 3+ VIEW; RIGHT FOREARM - 2 VIEW COMPARISON:  None Available. FINDINGS: The mineralization and alignment are normal. There is no evidence of acute fracture or dislocation. The joint spaces are preserved. No evidence of elbow joint effusion. Fragmented spurring of the olecranon process does not appear acute. There is also mild spurring of the coronoid process. There is prominent soft tissue  swelling posteriorly in the elbow, extending into the proximal forearm. No evidence of foreign body or soft tissue emphysema. IMPRESSION: 1. Dorsal soft tissue swelling at the elbow extending into the forearm, suspicious for soft tissue infection (cellulitis with possible bursitis). 2. No evidence of elbow joint effusion, acute fracture or dislocation. Electronically Signed   By: Carey Bullocks M.D.   On: 11/29/2021 09:43       The results of significant diagnostics from this hospitalization (including imaging, microbiology, ancillary and laboratory) are listed below for reference.     Microbiology: Recent Results (from the past 240 hour(s))  Culture, blood (Routine x 2)     Status: None (Preliminary result)   Collection Time: 11/29/21  8:50 AM   Specimen: BLOOD  Result Value Ref Range Status   Specimen Description   Final    BLOOD LEFT ANTECUBITAL Performed at [facility], [address] [place], [place] [address]    Special Requests   Final    BOTTLES DRAWN AEROBIC AND ANAEROBIC Blood Culture adequate volume Performed at [facility], [address] [place], [place] [address]    Culture   Final    NO GROWTH < 24 HOURS Performed at [facility], [address], [place], [place] [address]    Report Status PENDING  Incomplete  Culture, blood (Routine x 2)     Status: None (Preliminary result)   Collection Time: 11/29/21  9:05 AM   Specimen: BLOOD LEFT WRIST  Result Value Ref Range Status   Specimen Description   Final    BLOOD LEFT WRIST Performed at [facility], [address] [place], [place] [address]    Special Requests   Final    BOTTLES DRAWN AEROBIC AND ANAEROBIC Blood Culture results may not be optimal due to an inadequate volume of blood received in culture bottles Performed at [facility], [address] [place], [place] [address]    Culture   Final    NO GROWTH < 24 HOURS Performed at [facility],  [address] [place], [place] [address]    Report Status PENDING  Incomplete  Surgical pcr screen     Status: Abnormal   Collection Time: 11/30/21  3:30 AM   Specimen: Nasal Mucosa; Nasal Swab  Result Value Ref Range Status   MRSA, PCR POSITIVE (A) NEGATIVE Final    Comment: RESULT CALLED TO, READ BACK BY AND VERIFIED WITH: K GENGLER,RN@0510  11/30/21 MK    Staphylococcus aureus POSITIVE (A) NEGATIVE Final    Comment: (NOTE) The Xpert SA Assay (FDA approved for NASAL specimens in patients 53 years of age and older), is one component of a comprehensive surveillance program. It is not intended to diagnose infection nor to guide or monitor treatment. Performed at [facility], [address], [place], [place] [address]   Aerobic/Anaerobic Culture w Gram Stain (surgical/deep wound)     Status: None (Preliminary result)   Collection Time: 11/30/21  3:16 PM   Specimen: PATH Other; Body Fluid  Result Value Ref Range  Status   Specimen Description WOUND  Final   Special Requests RT ELBOW DRAINAGE  Final   Gram Stain   Final    FEW WBC PRESENT, PREDOMINANTLY MONONUCLEAR FEW GRAM POSITIVE COCCI IN PAIRS AND CHAINS Performed at [facility], [address] [place], [place] [address]    Culture PENDING  Incomplete   Report Status PENDING  Incomplete     Labs: CBC: Recent Labs  Lab 11/29/21 0850 11/30/21 0815  WBC 10.9* 10.4  NEUTROABS 7.3  --   HGB 12.5* 11.0*  HCT 35.7* 31.4*  MCV 86.2 86.0  PLT 454* 447*    Basic Metabolic Panel: Recent Labs  Lab 11/29/21 0850 11/29/21 1524 11/30/21 0815  NA 136  --  139  K 3.5  --  3.8  CL 103  --  110  CO2 25  --  22  GLUCOSE 108*  --  110*  BUN 49*  --  22*  CREATININE 2.58*  --  1.70*  CALCIUM 8.6*  --  8.5*  MG  --  2.0  --     Liver Function Tests: Recent Labs  Lab 11/29/21 0850  AST 42*  ALT 44  ALKPHOS 65  BILITOT 0.3  PROT 8.4*  ALBUMIN 2.4*    Urinalysis    Component Value Date/Time    COLORURINE YELLOW 11/29/2021 0845   APPEARANCEUR CLEAR 11/29/2021 0845   LABSPEC 1.010 11/29/2021 0845   PHURINE 5.5 11/29/2021 0845   GLUCOSEU NEGATIVE 11/29/2021 0845   HGBUR TRACE (A) 11/29/2021 0845   BILIRUBINUR NEGATIVE 11/29/2021 0845   KETONESUR NEGATIVE 11/29/2021 0845   PROTEINUR NEGATIVE 11/29/2021 0845   UROBILINOGEN 0.2 12/31/2013 0155   NITRITE NEGATIVE 11/29/2021 0845   LEUKOCYTESUR NEGATIVE 11/29/2021 0845      Time coordinating discharge: 15 minutes  SIGNED:  Vernell Leep, MD,  FACP, [facility], [facility], [facility] (Care Management Physician Certified). [facility]  To contact the attending provider between 7A-7P or the covering provider during after hours 7P-7A, please log into the web site [email or web address] and access using universal [place] password for that web site. If you do not have the password, please call the hospital operator.

## 2021-12-04 LAB — CULTURE, BLOOD (ROUTINE X 2)
Culture: NO GROWTH
Culture: NO GROWTH
Special Requests: ADEQUATE

## 2021-12-05 LAB — AEROBIC/ANAEROBIC CULTURE W GRAM STAIN (SURGICAL/DEEP WOUND)
